# Patient Record
Sex: Female | Born: 1950
Health system: Southern US, Community
[De-identification: ages and names within clinical notes are randomized; demographics above are authoritative.]

## PROBLEM LIST (undated history)

## (undated) DIAGNOSIS — M810 Age-related osteoporosis without current pathological fracture: Secondary | ICD-10-CM

## (undated) DIAGNOSIS — R202 Paresthesia of skin: Secondary | ICD-10-CM

## (undated) DIAGNOSIS — E7800 Pure hypercholesterolemia, unspecified: Secondary | ICD-10-CM

## (undated) DIAGNOSIS — T7840XA Allergy, unspecified, initial encounter: Secondary | ICD-10-CM

## (undated) DIAGNOSIS — R200 Anesthesia of skin: Secondary | ICD-10-CM

## (undated) DIAGNOSIS — I10 Essential (primary) hypertension: Secondary | ICD-10-CM

## (undated) DIAGNOSIS — Z5189 Encounter for other specified aftercare: Secondary | ICD-10-CM

## (undated) DIAGNOSIS — J302 Other seasonal allergic rhinitis: Secondary | ICD-10-CM

## (undated) DIAGNOSIS — Z9109 Other allergy status, other than to drugs and biological substances: Secondary | ICD-10-CM

## (undated) DIAGNOSIS — K5790 Diverticulosis of intestine, part unspecified, without perforation or abscess without bleeding: Secondary | ICD-10-CM

## (undated) DIAGNOSIS — R011 Cardiac murmur, unspecified: Secondary | ICD-10-CM

## (undated) DIAGNOSIS — J45909 Unspecified asthma, uncomplicated: Secondary | ICD-10-CM

## (undated) DIAGNOSIS — C50919 Malignant neoplasm of unspecified site of unspecified female breast: Secondary | ICD-10-CM

## (undated) DIAGNOSIS — C029 Malignant neoplasm of tongue, unspecified: Secondary | ICD-10-CM

## (undated) HISTORY — DX: Pure hypercholesterolemia, unspecified: E78.00

## (undated) HISTORY — PX: FOOT SURGERY: SHX648

## (undated) HISTORY — PX: PELVIC LAPAROSCOPY: SHX162

## (undated) HISTORY — PX: COLONOSCOPY: SHX174

## (undated) HISTORY — PX: MASTECTOMY: SHX3

## (undated) HISTORY — PX: DILATION AND CURETTAGE OF UTERUS: SHX78

## (undated) HISTORY — DX: Paresthesia of skin: R20.2

## (undated) HISTORY — DX: Encounter for other specified aftercare: Z51.89

## (undated) HISTORY — DX: Allergy, unspecified, initial encounter: T78.40XA

## (undated) HISTORY — PX: ECTOPIC PREGNANCY SURGERY: SHX613

## (undated) HISTORY — DX: Unspecified asthma, uncomplicated: J45.909

## (undated) HISTORY — DX: Anesthesia of skin: R20.0

## (undated) HISTORY — DX: Age-related osteoporosis without current pathological fracture: M81.0

## (undated) HISTORY — DX: Diverticulosis of intestine, part unspecified, without perforation or abscess without bleeding: K57.90

## (undated) HISTORY — DX: Other allergy status, other than to drugs and biological substances: Z91.09

## (undated) HISTORY — DX: Cardiac murmur, unspecified: R01.1

## (undated) HISTORY — DX: Other seasonal allergic rhinitis: J30.2

---

## 1990-04-25 HISTORY — PX: BREAST SURGERY: SHX581

## 1992-04-25 HISTORY — PX: BONE MARROW TRANSPLANT: SHX200

## 1998-02-19 ENCOUNTER — Encounter: Admission: RE | Admit: 1998-02-19 | Discharge: 1998-05-20 | Payer: Self-pay | Admitting: Radiation Oncology

## 1998-12-22 ENCOUNTER — Other Ambulatory Visit: Admission: RE | Admit: 1998-12-22 | Discharge: 1998-12-22 | Payer: Self-pay | Admitting: Gynecology

## 1999-04-28 ENCOUNTER — Encounter: Admission: RE | Admit: 1999-04-28 | Discharge: 1999-04-28 | Payer: Self-pay | Admitting: Oncology

## 1999-04-28 ENCOUNTER — Encounter: Payer: Self-pay | Admitting: Oncology

## 1999-10-08 ENCOUNTER — Ambulatory Visit (HOSPITAL_COMMUNITY): Admission: RE | Admit: 1999-10-08 | Discharge: 1999-10-08 | Payer: Self-pay | Admitting: Sports Medicine

## 1999-10-08 ENCOUNTER — Encounter: Payer: Self-pay | Admitting: Sports Medicine

## 1999-10-18 ENCOUNTER — Encounter: Payer: Self-pay | Admitting: Oncology

## 1999-10-18 ENCOUNTER — Encounter: Admission: RE | Admit: 1999-10-18 | Discharge: 1999-10-18 | Payer: Self-pay | Admitting: Oncology

## 2000-01-26 ENCOUNTER — Other Ambulatory Visit: Admission: RE | Admit: 2000-01-26 | Discharge: 2000-01-26 | Payer: Self-pay | Admitting: Gynecology

## 2000-02-01 ENCOUNTER — Encounter: Admission: RE | Admit: 2000-02-01 | Discharge: 2000-02-01 | Payer: Self-pay | Admitting: Gynecology

## 2000-02-01 ENCOUNTER — Encounter: Payer: Self-pay | Admitting: Gynecology

## 2000-03-29 ENCOUNTER — Encounter: Payer: Self-pay | Admitting: Sports Medicine

## 2000-03-29 ENCOUNTER — Ambulatory Visit (HOSPITAL_COMMUNITY): Admission: RE | Admit: 2000-03-29 | Discharge: 2000-03-29 | Payer: Self-pay | Admitting: Sports Medicine

## 2000-10-18 ENCOUNTER — Encounter: Admission: RE | Admit: 2000-10-18 | Discharge: 2000-10-18 | Payer: Self-pay | Admitting: Oncology

## 2000-10-18 ENCOUNTER — Encounter: Payer: Self-pay | Admitting: Oncology

## 2001-02-08 ENCOUNTER — Other Ambulatory Visit: Admission: RE | Admit: 2001-02-08 | Discharge: 2001-02-08 | Payer: Self-pay | Admitting: Gynecology

## 2001-10-19 ENCOUNTER — Encounter: Admission: RE | Admit: 2001-10-19 | Discharge: 2001-10-19 | Payer: Self-pay | Admitting: Gynecology

## 2001-10-19 ENCOUNTER — Encounter: Payer: Self-pay | Admitting: Gynecology

## 2002-02-20 ENCOUNTER — Other Ambulatory Visit: Admission: RE | Admit: 2002-02-20 | Discharge: 2002-02-20 | Payer: Self-pay | Admitting: Gynecology

## 2002-10-21 ENCOUNTER — Encounter: Admission: RE | Admit: 2002-10-21 | Discharge: 2002-10-21 | Payer: Self-pay | Admitting: Gynecology

## 2002-10-21 ENCOUNTER — Encounter: Payer: Self-pay | Admitting: Gynecology

## 2003-04-07 ENCOUNTER — Other Ambulatory Visit: Admission: RE | Admit: 2003-04-07 | Discharge: 2003-04-07 | Payer: Self-pay | Admitting: Gynecology

## 2003-10-22 ENCOUNTER — Encounter: Admission: RE | Admit: 2003-10-22 | Discharge: 2003-10-22 | Payer: Self-pay | Admitting: Internal Medicine

## 2003-10-23 ENCOUNTER — Encounter: Admission: RE | Admit: 2003-10-23 | Discharge: 2003-10-23 | Payer: Self-pay | Admitting: Internal Medicine

## 2004-04-09 ENCOUNTER — Other Ambulatory Visit: Admission: RE | Admit: 2004-04-09 | Discharge: 2004-04-09 | Payer: Self-pay | Admitting: Gynecology

## 2004-10-22 ENCOUNTER — Encounter: Admission: RE | Admit: 2004-10-22 | Discharge: 2004-10-22 | Payer: Self-pay | Admitting: Internal Medicine

## 2005-04-11 ENCOUNTER — Other Ambulatory Visit: Admission: RE | Admit: 2005-04-11 | Discharge: 2005-04-11 | Payer: Self-pay | Admitting: Gynecology

## 2005-10-24 ENCOUNTER — Encounter: Admission: RE | Admit: 2005-10-24 | Discharge: 2005-10-24 | Payer: Self-pay | Admitting: Gynecology

## 2005-11-02 ENCOUNTER — Encounter: Admission: RE | Admit: 2005-11-02 | Discharge: 2005-11-02 | Payer: Self-pay | Admitting: Gynecology

## 2006-04-20 ENCOUNTER — Other Ambulatory Visit: Admission: RE | Admit: 2006-04-20 | Discharge: 2006-04-20 | Payer: Self-pay | Admitting: Gynecology

## 2006-11-29 ENCOUNTER — Encounter: Admission: RE | Admit: 2006-11-29 | Discharge: 2006-11-29 | Payer: Self-pay | Admitting: Gynecology

## 2006-12-19 ENCOUNTER — Ambulatory Visit: Payer: Self-pay | Admitting: Internal Medicine

## 2006-12-25 HISTORY — PX: POLYPECTOMY: SHX149

## 2007-01-01 ENCOUNTER — Encounter: Payer: Self-pay | Admitting: Internal Medicine

## 2007-01-01 ENCOUNTER — Ambulatory Visit: Payer: Self-pay | Admitting: Internal Medicine

## 2007-05-29 ENCOUNTER — Other Ambulatory Visit: Admission: RE | Admit: 2007-05-29 | Discharge: 2007-05-29 | Payer: Self-pay | Admitting: Gynecology

## 2007-11-30 ENCOUNTER — Encounter: Admission: RE | Admit: 2007-11-30 | Discharge: 2007-11-30 | Payer: Self-pay | Admitting: Gynecology

## 2008-06-02 ENCOUNTER — Encounter: Payer: Self-pay | Admitting: Gynecology

## 2008-06-02 ENCOUNTER — Other Ambulatory Visit: Admission: RE | Admit: 2008-06-02 | Discharge: 2008-06-02 | Payer: Self-pay | Admitting: Gynecology

## 2008-06-02 ENCOUNTER — Ambulatory Visit: Payer: Self-pay | Admitting: Gynecology

## 2008-12-01 ENCOUNTER — Encounter: Admission: RE | Admit: 2008-12-01 | Discharge: 2008-12-01 | Payer: Self-pay | Admitting: Internal Medicine

## 2009-04-25 DIAGNOSIS — C029 Malignant neoplasm of tongue, unspecified: Secondary | ICD-10-CM

## 2009-04-25 HISTORY — PX: TONGUE SURGERY: SHX810

## 2009-04-25 HISTORY — DX: Malignant neoplasm of tongue, unspecified: C02.9

## 2009-06-23 ENCOUNTER — Ambulatory Visit: Payer: Self-pay | Admitting: Gynecology

## 2009-06-23 ENCOUNTER — Other Ambulatory Visit: Admission: RE | Admit: 2009-06-23 | Discharge: 2009-06-23 | Payer: Self-pay | Admitting: Gynecology

## 2009-10-02 ENCOUNTER — Ambulatory Visit (HOSPITAL_COMMUNITY): Admission: RE | Admit: 2009-10-02 | Discharge: 2009-10-02 | Payer: Self-pay | Admitting: Otolaryngology

## 2009-10-06 ENCOUNTER — Ambulatory Visit (HOSPITAL_BASED_OUTPATIENT_CLINIC_OR_DEPARTMENT_OTHER): Admission: RE | Admit: 2009-10-06 | Discharge: 2009-10-06 | Payer: Self-pay | Admitting: Otolaryngology

## 2009-12-02 ENCOUNTER — Encounter: Admission: RE | Admit: 2009-12-02 | Discharge: 2009-12-02 | Payer: Self-pay | Admitting: Internal Medicine

## 2010-05-16 ENCOUNTER — Encounter: Payer: Self-pay | Admitting: Internal Medicine

## 2010-06-29 ENCOUNTER — Encounter (INDEPENDENT_AMBULATORY_CARE_PROVIDER_SITE_OTHER): Payer: BC Managed Care – PPO | Admitting: Gynecology

## 2010-06-29 ENCOUNTER — Other Ambulatory Visit (HOSPITAL_COMMUNITY)
Admission: RE | Admit: 2010-06-29 | Discharge: 2010-06-29 | Disposition: A | Discharge status: Home / Self Care | Payer: BC Managed Care – PPO | Source: Ambulatory Visit | Attending: Gynecology | Admitting: Gynecology

## 2010-06-29 ENCOUNTER — Other Ambulatory Visit: Payer: Self-pay | Admitting: Gynecology

## 2010-06-29 DIAGNOSIS — Z01419 Encounter for gynecological examination (general) (routine) without abnormal findings: Secondary | ICD-10-CM

## 2010-06-29 DIAGNOSIS — Z124 Encounter for screening for malignant neoplasm of cervix: Secondary | ICD-10-CM | POA: Insufficient documentation

## 2010-07-05 ENCOUNTER — Other Ambulatory Visit (HOSPITAL_COMMUNITY): Payer: Self-pay | Admitting: Otolaryngology

## 2010-07-05 DIAGNOSIS — Z85810 Personal history of malignant neoplasm of tongue: Secondary | ICD-10-CM

## 2010-07-07 ENCOUNTER — Other Ambulatory Visit (HOSPITAL_COMMUNITY): Payer: BC Managed Care – PPO

## 2010-07-08 ENCOUNTER — Encounter (HOSPITAL_COMMUNITY): Payer: Self-pay

## 2010-07-08 ENCOUNTER — Ambulatory Visit (HOSPITAL_COMMUNITY)
Admission: RE | Admit: 2010-07-08 | Discharge: 2010-07-08 | Disposition: A | Discharge status: Home / Self Care | Payer: BC Managed Care – PPO | Source: Ambulatory Visit | Attending: Otolaryngology | Admitting: Otolaryngology

## 2010-07-08 DIAGNOSIS — C029 Malignant neoplasm of tongue, unspecified: Secondary | ICD-10-CM | POA: Insufficient documentation

## 2010-07-08 DIAGNOSIS — Z85810 Personal history of malignant neoplasm of tongue: Secondary | ICD-10-CM

## 2010-07-08 DIAGNOSIS — M479 Spondylosis, unspecified: Secondary | ICD-10-CM | POA: Insufficient documentation

## 2010-07-08 DIAGNOSIS — Z853 Personal history of malignant neoplasm of breast: Secondary | ICD-10-CM | POA: Insufficient documentation

## 2010-07-08 HISTORY — DX: Essential (primary) hypertension: I10

## 2010-07-08 HISTORY — DX: Malignant neoplasm of tongue, unspecified: C02.9

## 2010-07-08 HISTORY — DX: Malignant neoplasm of unspecified site of unspecified female breast: C50.919

## 2010-07-08 MED ORDER — IOHEXOL 300 MG/ML  SOLN
100.0000 mL | Freq: Once | INTRAMUSCULAR | Status: AC | PRN
  Administered 2010-07-08: 100 mL via INTRAVENOUS

## 2010-07-12 LAB — BASIC METABOLIC PANEL
BUN: 17 mg/dL (ref 6–23)
CO2: 27 mEq/L (ref 19–32)
Calcium: 9.1 mg/dL (ref 8.4–10.5)
Chloride: 108 mEq/L (ref 96–112)
Creatinine, Ser: 0.84 mg/dL (ref 0.4–1.2)
GFR calc Af Amer: >60 mL/min (ref >60)
GFR calc non Af Amer: >60 mL/min (ref >60)

## 2010-08-04 ENCOUNTER — Other Ambulatory Visit: Payer: Self-pay | Admitting: Otolaryngology

## 2010-08-04 DIAGNOSIS — E041 Nontoxic single thyroid nodule: Secondary | ICD-10-CM

## 2010-08-09 ENCOUNTER — Other Ambulatory Visit (HOSPITAL_COMMUNITY): Payer: Self-pay | Admitting: Otolaryngology

## 2010-08-09 DIAGNOSIS — R599 Enlarged lymph nodes, unspecified: Secondary | ICD-10-CM

## 2010-08-10 ENCOUNTER — Ambulatory Visit (HOSPITAL_COMMUNITY)
Admission: RE | Admit: 2010-08-10 | Discharge: 2010-08-10 | Disposition: A | Discharge status: Home / Self Care | Payer: BLUE CROSS/BLUE SHIELD | Source: Ambulatory Visit | Attending: Otolaryngology | Admitting: Otolaryngology

## 2010-08-10 ENCOUNTER — Other Ambulatory Visit: Payer: BC Managed Care – PPO

## 2010-08-10 ENCOUNTER — Other Ambulatory Visit: Payer: Self-pay | Admitting: Interventional Radiology

## 2010-08-10 DIAGNOSIS — Z85810 Personal history of malignant neoplasm of tongue: Secondary | ICD-10-CM | POA: Insufficient documentation

## 2010-08-10 DIAGNOSIS — R599 Enlarged lymph nodes, unspecified: Secondary | ICD-10-CM | POA: Insufficient documentation

## 2010-09-24 HISTORY — PX: OTHER SURGICAL HISTORY: SHX169

## 2010-10-05 ENCOUNTER — Other Ambulatory Visit (HOSPITAL_COMMUNITY): Payer: Self-pay | Admitting: Otolaryngology

## 2010-10-05 DIAGNOSIS — IMO0002 Reserved for concepts with insufficient information to code with codable children: Secondary | ICD-10-CM

## 2010-10-11 ENCOUNTER — Encounter (HOSPITAL_COMMUNITY)
Admission: RE | Admit: 2010-10-11 | Discharge: 2010-10-11 | Disposition: A | Discharge status: Home / Self Care | Payer: BC Managed Care – PPO | Source: Ambulatory Visit | Attending: Otolaryngology | Admitting: Otolaryngology

## 2010-10-11 ENCOUNTER — Encounter (HOSPITAL_COMMUNITY): Payer: Self-pay

## 2010-10-11 DIAGNOSIS — Z9010 Acquired absence of unspecified breast and nipple: Secondary | ICD-10-CM | POA: Insufficient documentation

## 2010-10-11 DIAGNOSIS — IMO0002 Reserved for concepts with insufficient information to code with codable children: Secondary | ICD-10-CM

## 2010-10-11 DIAGNOSIS — C029 Malignant neoplasm of tongue, unspecified: Secondary | ICD-10-CM | POA: Insufficient documentation

## 2010-10-11 DIAGNOSIS — J984 Other disorders of lung: Secondary | ICD-10-CM | POA: Insufficient documentation

## 2010-10-11 MED ORDER — FLUDEOXYGLUCOSE F - 18 (FDG) INJECTION
16.9000 | Freq: Once | INTRAVENOUS | Status: AC | PRN
  Administered 2010-10-11: 16.9 via INTRAVENOUS

## 2010-10-13 ENCOUNTER — Encounter (HOSPITAL_COMMUNITY)
Admission: RE | Admit: 2010-10-13 | Discharge: 2010-10-13 | Disposition: A | Discharge status: Home / Self Care | Payer: BC Managed Care – PPO | Source: Ambulatory Visit | Attending: Otolaryngology | Admitting: Otolaryngology

## 2010-10-13 LAB — CBC
MCV: 96 fL (ref 78.0–100.0)
RBC: 3.97 MIL/uL (ref 3.87–5.11)
WBC: 8.2 10*3/uL (ref 4.0–10.5)

## 2010-10-13 LAB — COMPREHENSIVE METABOLIC PANEL
ALT: 21 U/L (ref 0–35)
Alkaline Phosphatase: 72 U/L (ref 39–117)
CO2: 26 mEq/L (ref 19–32)
GFR calc Af Amer: >60 mL/min (ref >60)
GFR calc non Af Amer: >60 mL/min (ref >60)
Glucose, Bld: 119 mg/dL — ABNORMAL HIGH (ref 70–99)
Potassium: 3.6 mEq/L (ref 3.5–5.1)
Sodium: 141 mEq/L (ref 135–145)
Total Bilirubin: 0.3 mg/dL (ref 0.3–1.2)

## 2010-10-13 LAB — DIFFERENTIAL
Basophils Relative: 0 % (ref 0–1)
Eosinophils Relative: 2 % (ref 0–5)
Lymphocytes Relative: 31 % (ref 12–46)
Monocytes Absolute: 0.6 10*3/uL (ref 0.1–1.0)

## 2010-10-13 LAB — URINALYSIS, ROUTINE W REFLEX MICROSCOPIC
Bilirubin Urine: NEGATIVE
Glucose, UA: 250 mg/dL — AB
Hgb urine dipstick: NEGATIVE
Specific Gravity, Urine: 1.016 (ref 1.005–1.030)

## 2010-10-13 LAB — PROTIME-INR: Prothrombin Time: 11.8 seconds (ref 11.6–15.2)

## 2010-10-18 ENCOUNTER — Inpatient Hospital Stay (HOSPITAL_COMMUNITY)
Admission: RE | Admit: 2010-10-18 | Discharge: 2010-10-20 | DRG: 875 | Disposition: A | Discharge status: Home / Self Care | Payer: BC Managed Care – PPO | Source: Ambulatory Visit | Attending: Otolaryngology | Admitting: Otolaryngology

## 2010-10-18 ENCOUNTER — Other Ambulatory Visit: Payer: Self-pay | Admitting: Otolaryngology

## 2010-10-18 DIAGNOSIS — Z85810 Personal history of malignant neoplasm of tongue: Secondary | ICD-10-CM

## 2010-10-18 DIAGNOSIS — I10 Essential (primary) hypertension: Secondary | ICD-10-CM | POA: Diagnosis present

## 2010-10-18 DIAGNOSIS — Z01810 Encounter for preprocedural cardiovascular examination: Secondary | ICD-10-CM

## 2010-10-18 DIAGNOSIS — Z79899 Other long term (current) drug therapy: Secondary | ICD-10-CM

## 2010-10-18 DIAGNOSIS — F411 Generalized anxiety disorder: Secondary | ICD-10-CM | POA: Diagnosis present

## 2010-10-18 DIAGNOSIS — C770 Secondary and unspecified malignant neoplasm of lymph nodes of head, face and neck: Principal | ICD-10-CM | POA: Diagnosis present

## 2010-10-18 DIAGNOSIS — Z853 Personal history of malignant neoplasm of breast: Secondary | ICD-10-CM

## 2010-11-03 ENCOUNTER — Ambulatory Visit
Admission: RE | Admit: 2010-11-03 | Discharge: 2010-11-03 | Disposition: A | Discharge status: Home / Self Care | Payer: BC Managed Care – PPO | Source: Ambulatory Visit | Attending: Radiation Oncology | Admitting: Radiation Oncology

## 2010-11-03 DIAGNOSIS — Z853 Personal history of malignant neoplasm of breast: Secondary | ICD-10-CM | POA: Insufficient documentation

## 2010-11-03 DIAGNOSIS — E7800 Pure hypercholesterolemia, unspecified: Secondary | ICD-10-CM | POA: Insufficient documentation

## 2010-11-03 DIAGNOSIS — Z79899 Other long term (current) drug therapy: Secondary | ICD-10-CM | POA: Insufficient documentation

## 2010-11-03 DIAGNOSIS — C021 Malignant neoplasm of border of tongue: Secondary | ICD-10-CM | POA: Insufficient documentation

## 2010-11-17 NOTE — Op Note (Signed)
Diane Macdonald, Diane Macdonald                 ACCOUNT NO.:  [id number]  MEDICAL RECORD NO.:  [id number]  LOCATION:                                 FACILITY:  PHYSICIAN:  Kristine Garbe. Ezzard Standing, M.D.DATE OF BIRTH:  1951/01/08  DATE OF PROCEDURE: DATE OF DISCHARGE:                              OPERATIVE REPORT   PREOPERATIVE DIAGNOSIS:  Metastatic squamous cell carcinoma to the right neck.  POSTOPERATIVE DIAGNOSIS:  Metastatic squamous cell carcinoma to the right neck.  OPERATION PERFORMED:  Modified right radical neck dissection.  SURGEON:  Kristine Garbe. Ezzard Standing, MD  ASSISTANT SURGEON:  Hermelinda Medicus, MD  ANESTHESIA:  General endotracheal.  ESTIMATED BLOOD LOSS:  Less than 50 mL.  COMPLICATIONS:  None.  CLINICAL NOTE:  Diane Macdonald is a 60 year old female who is approximately 1 year status post a right partial glossectomy for a right lateral tongue T1 squamous cell carcinoma.  She was subsequently found to have adenopathy lower in the neck on followup CT scan with fine needle aspirate positive for squamous cell carcinoma.  She is taken to the operating room at this time for a right neck dissection for metastatic squamous cell carcinoma from right lateral tongue.  DESCRIPTION OF PROCEDURE:  After adequate endotracheal anesthesia, the right neck node was palpated and it was located mid lower jugular chain of lymph nodes just on the posterior portion of the sternocleidomastoid muscle on the right side.  It measured a little less than 2 cm in size and was firm to palpation.  She had no other palpable adenopathy and no other adenopathy noted on the PET scan.  A horizontal incision was started at the mastoid tip and brought down mid right neck just above the level of the mass.  A separate vertical incision was brought back just posterior to the mass.  Subplatysmal flaps were elevated superiorly and inferiorly so as to expose the external jugular vein and the sternocleidomastoid muscle.   It was elected to preserve the sternocleidomastoid muscle except for the area just overlying the neck node and this portion of the sternocleidomastoid muscle overlying the neck node was amputated and transected and occluded with the specimen. First dissection was carried out superiorly where the accessory nerve, eleventh nerve was identified as it exited superiorly from the sternocleidomastoid muscle.  Dissection was then carried out toward the submandibular gland which was preserved and not sacrificed.  There was one node posteriorly along the posterior aspect of the submandibular gland that was removed and sent as a separate specimen as the submandibular lymph node.  Digastric muscle was identified and this was followed posteriorly and retracted superiorly so as to identify the carotid artery, vagus nerve, and internal jugular vein as well as the accessory nerve.  At this point, just below the posterior belly of the digastric muscle, the internal jugular vein was dissected out and being careful to preserve the eleventh cranial nerve, the jugular vein was ligated with 2-0 silk suture and 2-0 silk suture ligature and divided superiorly.  The sensory nerve was followed down the posterior aspect of the neck and preserved.  Next, the vagus nerve and carotid artery were identified and the jugular vein was  dissected off the carotid artery and vagus nerve extending inferiorly.  The lower neck node that was positive was sitting just above the omohyoid muscle and it was elected to go ahead and take the omohyoid muscle and take the jugular vein down just inferior to the omohyoid muscle.  She had had previous supraclavicular nodes removed because of the history of breast cancer 15 years ago and there was some scar tissue noted along the internal jugular vein in the inferior aspect.  At this point, just below the omohyoid muscle, the inferior aspect of the jugular vein was carefully dissected out  and the jugular vein was again dissected out and ligated with 2-0 silk suture and 2-0 silk suture ligature and divided inferiorly.  The jugular vein along the associated lymphadenopathy was dissected off the deep cervical fascia.  The supraclavicular fat was dissected off just superior to the clavicle down to the deep cervical fascia.  The superior jugular vein was ligated with 2-0 silk sutures superiorly and inferiorly.  The sternocleidomastoid muscle was dissected out and preserved except for the portion overlying the positive adenopathy on the right neck, which was left attached.  Neck specimen was removed and sent to Pathology.  A long stitch was placed around the superior aspect of the external jugular vein.  The inferior aspect of the internal jugular vein suture was left approximately length of about 3 cm and was left attached to the inferior aspect of the jugular vein inferiorly.  The upper ligation sutures of the internal jugular vein were cut to the several millimeters length.  Neck specimen was sent and marked with a single long suture, 6- 8 cm long off the superior aspect external jugular vein, a 3-4 cm suture off the ligated portion of the inferior aspect of the internal jugular vein.  A separate submandibular node was removed and sent as a separate specimen.  Hemostasis was obtained with a bipolar cautery.  Neck was copiously irrigated with saline.  A 14-French JP drain was brought out through a separate stab incision posteriorly and inferiorly in the neck and the neck was closed with 3-0 chromic sutures subcutaneously and staples to reapproximate the skin edges.  Bacitracin ointment was applied.  Diane Macdonald was awoke from anesthesia and transferred to the recovery room postop doing well.  DISPOSITION:  Diane Macdonald will be admitted to the hospital for a couple of day observation and watch her JP drain.  We will plan on removing this in 2- 3 days and discharge her home.  She received  perioperative antibiotic Ancef.          ______________________________ Kristine Garbe. Ezzard Standing, M.D.     CEN/MEDQ  D:  10/18/2010  T:  10/19/2010  Job:  [id number]  cc:   [facility] Geoffry Paradise, M.D. Redge Gainer. Perini, M.D. Hermelinda Medicus, M.D.  Electronically Signed by Dillard Cannon M.D. on 11/17/2010 11:35:55 AM

## 2010-11-30 ENCOUNTER — Other Ambulatory Visit: Payer: Self-pay | Admitting: Internal Medicine

## 2010-11-30 DIAGNOSIS — Z1231 Encounter for screening mammogram for malignant neoplasm of breast: Secondary | ICD-10-CM

## 2010-12-07 ENCOUNTER — Ambulatory Visit: Payer: BC Managed Care – PPO

## 2010-12-07 ENCOUNTER — Ambulatory Visit
Admission: RE | Admit: 2010-12-07 | Discharge: 2010-12-07 | Disposition: A | Discharge status: Home / Self Care | Payer: BC Managed Care – PPO | Source: Ambulatory Visit | Attending: Internal Medicine | Admitting: Internal Medicine

## 2010-12-07 DIAGNOSIS — Z1231 Encounter for screening mammogram for malignant neoplasm of breast: Secondary | ICD-10-CM

## 2011-06-24 ENCOUNTER — Other Ambulatory Visit: Payer: Self-pay | Admitting: Otolaryngology

## 2011-06-24 DIAGNOSIS — R599 Enlarged lymph nodes, unspecified: Secondary | ICD-10-CM

## 2011-06-24 DIAGNOSIS — R591 Generalized enlarged lymph nodes: Secondary | ICD-10-CM

## 2011-06-29 ENCOUNTER — Ambulatory Visit
Admission: RE | Admit: 2011-06-29 | Discharge: 2011-06-29 | Disposition: A | Discharge status: Home / Self Care | Payer: BC Managed Care – PPO | Source: Ambulatory Visit | Attending: Otolaryngology | Admitting: Otolaryngology

## 2011-06-29 DIAGNOSIS — M8580 Other specified disorders of bone density and structure, unspecified site: Secondary | ICD-10-CM | POA: Insufficient documentation

## 2011-06-29 DIAGNOSIS — R599 Enlarged lymph nodes, unspecified: Secondary | ICD-10-CM

## 2011-06-29 DIAGNOSIS — R591 Generalized enlarged lymph nodes: Secondary | ICD-10-CM

## 2011-06-29 DIAGNOSIS — E7800 Pure hypercholesterolemia, unspecified: Secondary | ICD-10-CM | POA: Insufficient documentation

## 2011-06-29 DIAGNOSIS — C50919 Malignant neoplasm of unspecified site of unspecified female breast: Secondary | ICD-10-CM | POA: Insufficient documentation

## 2011-06-29 MED ORDER — IOHEXOL 300 MG/ML  SOLN
75.0000 mL | Freq: Once | INTRAMUSCULAR | Status: AC | PRN
  Administered 2011-06-29: 75 mL via INTRAVENOUS

## 2011-07-12 ENCOUNTER — Ambulatory Visit (INDEPENDENT_AMBULATORY_CARE_PROVIDER_SITE_OTHER): Payer: BC Managed Care – PPO | Admitting: Gynecology

## 2011-07-12 ENCOUNTER — Encounter: Payer: Self-pay | Admitting: Gynecology

## 2011-07-12 ENCOUNTER — Other Ambulatory Visit (HOSPITAL_COMMUNITY)
Admission: RE | Admit: 2011-07-12 | Discharge: 2011-07-12 | Disposition: A | Discharge status: Home / Self Care | Payer: BC Managed Care – PPO | Source: Ambulatory Visit | Attending: Gynecology | Admitting: Gynecology

## 2011-07-12 VITALS — BP 126/82 | Ht 63.5 in | Wt 145.0 lb

## 2011-07-12 DIAGNOSIS — J302 Other seasonal allergic rhinitis: Secondary | ICD-10-CM | POA: Insufficient documentation

## 2011-07-12 DIAGNOSIS — Z9109 Other allergy status, other than to drugs and biological substances: Secondary | ICD-10-CM | POA: Insufficient documentation

## 2011-07-12 DIAGNOSIS — Z01419 Encounter for gynecological examination (general) (routine) without abnormal findings: Secondary | ICD-10-CM

## 2011-07-12 NOTE — Progress Notes (Signed)
LOYOLA SANTINO 26-Oct-1950 [id number]        61 y.o.  for annual exam.  Doing well from a gynecologic standpoint.  Past medical history,surgical history, medications, allergies, family history and social history were all reviewed and documented in the EPIC chart. ROS:  Was performed and pertinent positives and negatives are included in the history.  Exam: Sherrilyn Rist chaperone present Filed Vitals:   07/12/11 1547  BP: 126/82   General appearance  Normal Skin grossly normal Head/Neck normal with no cervical or supraclavicular adenopathy thyroid normal Lungs  clear Cardiac RR, without RMG Abdominal  soft, nontender, without masses, organomegaly or hernia Breasts  examined lying and sitting. Left without masses, retractions, discharge or axillary adenopathy.  Right with implant/reconstruction without masses retractions discharge adenopathy. Pelvic  Ext/BUS/vagina  normal with atrophic genital changes  Cervix  normal  Atrophic changes Pap done  Uterus  axial, normal size, shape and contour, midline and mobile nontender   Adnexa  Without masses or tenderness    Anus and perineum  normal   Rectovaginal  normal sphincter tone without palpated masses or tenderness.    Assessment/Plan:  61 y.o. female for annual exam.    1. History tongue cancer. Had right neck dissection with lymph node dissection with one positive node this past year. Being followed expectantly at this point. Actively seen by her oncologist. 2. History of breast cancer. Had her mammogram August 2012. Continue with annual mammography. SBE reviewed. 3. Colonoscopy. Due this year and she is scheduling this. 4. Osteopenia. Due for bone density this year to Dr. Daine Gravel office and we'll schedule this. She is on Evista.  Increase calcium vitamin D. 5. Pap smear. Pap smear was done today given her unusual cancer history we'll continue to screen annually. 6. Health maintenance. No blood work was done today as is all done through Dr.  Daine Gravel office who follows her. Assuming she continues well from a gynecologic standpoint and she will see me in one year sooner as needed.    Dara Lords MD, 4:14 PM 07/12/2011

## 2011-07-12 NOTE — Patient Instructions (Signed)
Follow up in one year for your annual gynecologic exam. 

## 2011-11-08 ENCOUNTER — Other Ambulatory Visit: Payer: Self-pay | Admitting: Internal Medicine

## 2011-11-08 DIAGNOSIS — Z9011 Acquired absence of right breast and nipple: Secondary | ICD-10-CM

## 2011-11-08 DIAGNOSIS — Z1231 Encounter for screening mammogram for malignant neoplasm of breast: Secondary | ICD-10-CM

## 2011-12-09 ENCOUNTER — Ambulatory Visit: Payer: BC Managed Care – PPO

## 2011-12-15 ENCOUNTER — Ambulatory Visit
Admission: RE | Admit: 2011-12-15 | Discharge: 2011-12-15 | Disposition: A | Discharge status: Home / Self Care | Payer: BC Managed Care – PPO | Source: Ambulatory Visit | Attending: Internal Medicine | Admitting: Internal Medicine

## 2011-12-15 DIAGNOSIS — Z1231 Encounter for screening mammogram for malignant neoplasm of breast: Secondary | ICD-10-CM

## 2011-12-15 DIAGNOSIS — Z9011 Acquired absence of right breast and nipple: Secondary | ICD-10-CM

## 2012-02-10 ENCOUNTER — Encounter: Payer: Self-pay | Admitting: Internal Medicine

## 2012-07-12 ENCOUNTER — Other Ambulatory Visit (HOSPITAL_COMMUNITY)
Admission: RE | Admit: 2012-07-12 | Discharge: 2012-07-12 | Disposition: A | Discharge status: Home / Self Care | Payer: BC Managed Care – PPO | Source: Ambulatory Visit | Attending: Gynecology | Admitting: Gynecology

## 2012-07-12 ENCOUNTER — Ambulatory Visit (INDEPENDENT_AMBULATORY_CARE_PROVIDER_SITE_OTHER): Payer: Self-pay | Admitting: Gynecology

## 2012-07-12 ENCOUNTER — Encounter: Payer: Self-pay | Admitting: Gynecology

## 2012-07-12 VITALS — BP 124/76 | Ht 64.0 in | Wt 148.0 lb

## 2012-07-12 DIAGNOSIS — Z01419 Encounter for gynecological examination (general) (routine) without abnormal findings: Secondary | ICD-10-CM | POA: Insufficient documentation

## 2012-07-12 DIAGNOSIS — M8580 Other specified disorders of bone density and structure, unspecified site: Secondary | ICD-10-CM

## 2012-07-12 DIAGNOSIS — Z1151 Encounter for screening for human papillomavirus (HPV): Secondary | ICD-10-CM | POA: Insufficient documentation

## 2012-07-12 DIAGNOSIS — M899 Disorder of bone, unspecified: Secondary | ICD-10-CM

## 2012-07-12 DIAGNOSIS — M949 Disorder of cartilage, unspecified: Secondary | ICD-10-CM

## 2012-07-12 NOTE — Progress Notes (Signed)
Diane Macdonald 03/21/1951 [id number]        62 y.o.  [id number] for annual exam.  Doing well from a gynecologic standpoint.  Past medical history,surgical history, medications, allergies, family history and social history were all reviewed and documented in the EPIC chart. ROS:  Was performed and pertinent positives and negatives are included in the history.  Exam: Kim assistant Filed Vitals:   07/12/12 1500  BP: 124/76  Height: 5\' 4"  (1.626 m)  Weight: 148 lb (67.132 kg)   General appearance  Normal Skin grossly normal Head/Neck normal with no cervical or supraclavicular adenopathy thyroid normal Lungs  clear Cardiac RR, without RMG Abdominal  soft, nontender, without masses, organomegaly or hernia Breasts  examined lying and sitting. Left without masses, retractions, discharge or axillary adenopathy.  Right status post reconstruction with implant without masses or adenopathy. Pelvic  Ext/BUS/vagina  normal with atrophic changes  Cervix  flush with the upper vagina  Uterus  small midline mobile nontender  Adnexa  Without masses or tenderness    Anus and perineum  normal   Rectovaginal  normal sphincter tone without palpated masses or tenderness.    Assessment/Plan:  62 y.o. [id number] female for annual exam.   1. Postmenopausal. Doing well from a lack of symptoms standpoint without significant hot flashes night sweats vaginal dryness or irritation. We'll continue to monitor. 2. History of breast cancer 1994. Up to date with mammograms. SBE reviewed.  Discussed BRCA testing area she did develop breast cancer at age 41. There are no other family members with breast cancer or ovarian cancer the pros/cons of BRCA testing reviewed particularly for her daughters standpoint as well as the issues of prophylactic oophorectomy. 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 Prior ultrasound did not identify ovarian tissue following that consistent  with atrophic changes. Patient at this point does not want BRCA testing. 3. Tongue cancer 2011.  Actively being followed by oncology. 4. Osteopenia. DEXA reported 2 years ago. Do not have a report of this and Dr. Jacky Kindle is following her for this. Increase calcium vitamin D reviewed. 5. Pap smear 2013. Pap/HPV done today given her unusual history of carcinoma in the past. Continue with annual Pap smears. 6. Colonoscopy 6 years ago. Repeat at their recommended interval. 7. Health maintenance. The blood work done this is all done through her primary physician's office. Followup one year, sooner as needed.    Dara Lords MD, 3:30 PM 07/12/2012

## 2012-07-12 NOTE — Patient Instructions (Addendum)
Followup in 1 year for GYN exam.

## 2012-07-13 LAB — URINALYSIS W MICROSCOPIC + REFLEX CULTURE
Bilirubin Urine: NEGATIVE
Glucose, UA: 100 mg/dL — AB
Hgb urine dipstick: NEGATIVE
Ketones, ur: NEGATIVE mg/dL
Protein, ur: NEGATIVE mg/dL
Squamous Epithelial / LPF: NONE SEEN

## 2012-07-18 ENCOUNTER — Other Ambulatory Visit: Payer: Self-pay | Admitting: Gynecology

## 2012-07-18 DIAGNOSIS — R81 Glycosuria: Secondary | ICD-10-CM

## 2012-08-22 ENCOUNTER — Encounter: Payer: Self-pay | Admitting: Internal Medicine

## 2012-10-17 ENCOUNTER — Other Ambulatory Visit: Payer: Self-pay | Admitting: Otolaryngology

## 2012-10-17 DIAGNOSIS — Z85810 Personal history of malignant neoplasm of tongue: Secondary | ICD-10-CM

## 2012-10-30 ENCOUNTER — Ambulatory Visit
Admission: RE | Admit: 2012-10-30 | Discharge: 2012-10-30 | Disposition: A | Discharge status: Home / Self Care | Payer: BC Managed Care – PPO | Source: Ambulatory Visit | Attending: Otolaryngology | Admitting: Otolaryngology

## 2012-10-30 DIAGNOSIS — Z85810 Personal history of malignant neoplasm of tongue: Secondary | ICD-10-CM

## 2012-10-30 MED ORDER — IOHEXOL 300 MG/ML  SOLN
75.0000 mL | Freq: Once | INTRAMUSCULAR | Status: AC | PRN
  Administered 2012-10-30: 75 mL via INTRAVENOUS

## 2012-12-03 ENCOUNTER — Other Ambulatory Visit: Payer: Self-pay

## 2012-12-03 DIAGNOSIS — Z1231 Encounter for screening mammogram for malignant neoplasm of breast: Secondary | ICD-10-CM

## 2012-12-03 DIAGNOSIS — Z9011 Acquired absence of right breast and nipple: Secondary | ICD-10-CM

## 2012-12-03 DIAGNOSIS — Z853 Personal history of malignant neoplasm of breast: Secondary | ICD-10-CM

## 2012-12-17 ENCOUNTER — Ambulatory Visit: Payer: BC Managed Care – PPO

## 2013-01-09 ENCOUNTER — Ambulatory Visit
Admission: RE | Admit: 2013-01-09 | Discharge: 2013-01-09 | Disposition: A | Discharge status: Home / Self Care | Payer: BC Managed Care – PPO | Source: Ambulatory Visit

## 2013-01-09 DIAGNOSIS — Z1231 Encounter for screening mammogram for malignant neoplasm of breast: Secondary | ICD-10-CM

## 2013-01-09 DIAGNOSIS — Z9011 Acquired absence of right breast and nipple: Secondary | ICD-10-CM

## 2013-01-09 DIAGNOSIS — Z853 Personal history of malignant neoplasm of breast: Secondary | ICD-10-CM

## 2013-04-25 DIAGNOSIS — M810 Age-related osteoporosis without current pathological fracture: Secondary | ICD-10-CM

## 2013-04-25 HISTORY — DX: Age-related osteoporosis without current pathological fracture: M81.0

## 2013-07-02 IMAGING — CT CT NECK W/ CM
4 of 5 series · 10 of 20 positions shown, 11 images · IV contrast (75CC OMNI 300)
Comparison: Free node dissection PET CT 10/11/2010 and Neck CT
07/08/2010.

CLINICAL DATA: 60-year-old female with history of tongue cancer
status post surgery, node dissection. Node dissection on the right
at level III positive for metastatic squamous cell carcinoma with
no extracapsular extension.. Question mass at surgical site.
Remote history of breast cancer (6889).

CT NECK WITH CONTRAST
TECHNIQUE: Multidetector CT imaging of the neck was performed with
intravenous contrast.
Contrast: 75mL OMNIPAQUE IOHEXOL 300 MG/ML IJ SOLN

[Series 3: axial neck · axial · 0.39mm/px · z∈[+88,+204]mm · 3 of 94 slices shown, 4 images]
[im 24/94  soft-tissue]
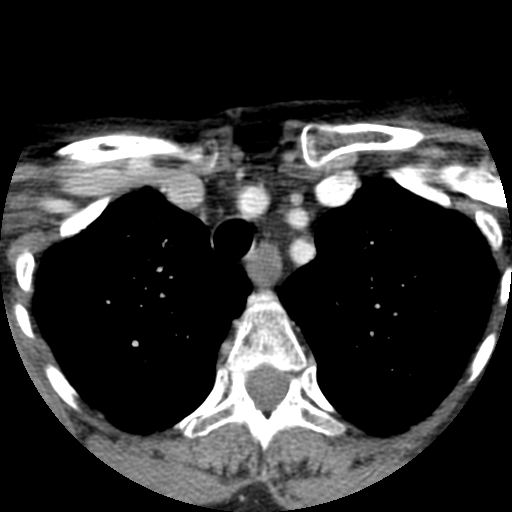
[im 24/94  bone]
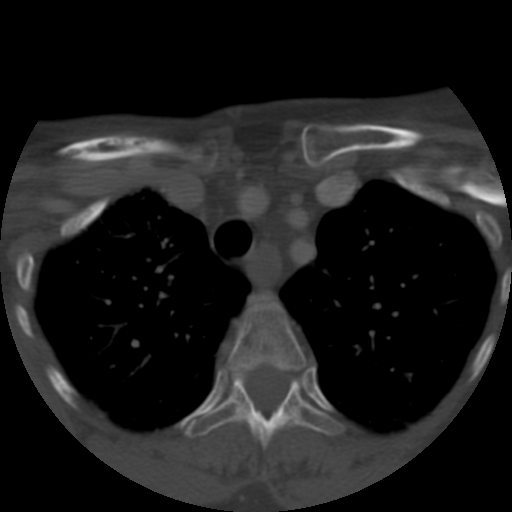
[im 47/94  bone]
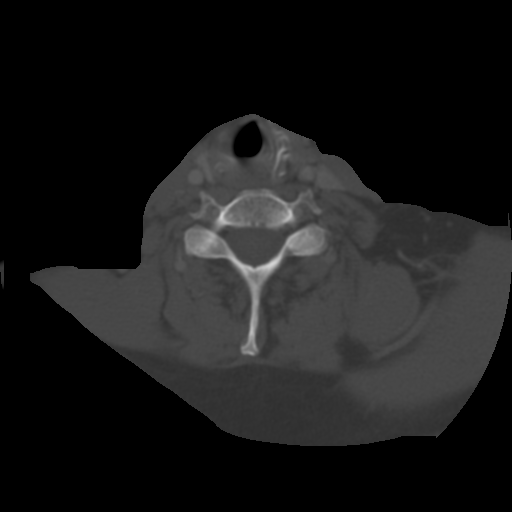
[im 70/94  bone]
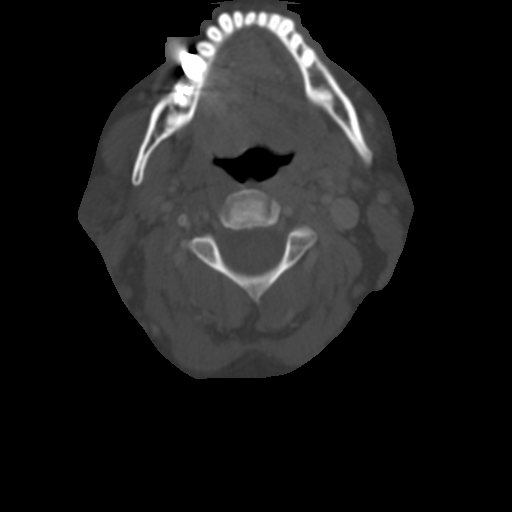

[Series 200: angled axial · axial · 0.39mm/px · z∈[+83,+154]mm · 2 of 92 slices shown]
[im 31/92  bone]
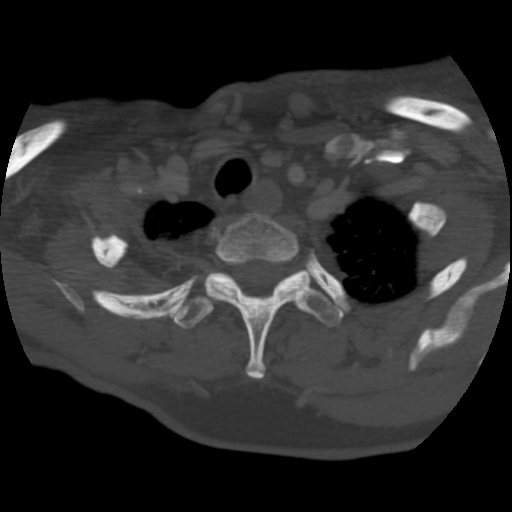
[im 61/92  bone]
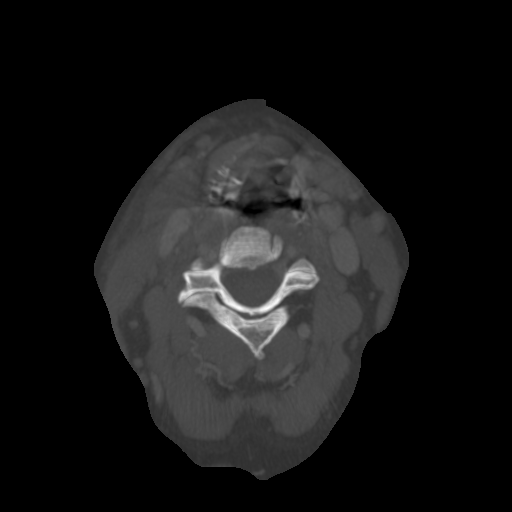

[Series 601: cor soft tissue neck · coronal · 0.46mm/px · 3 of 97 slices shown]
[im 20/97  bone]
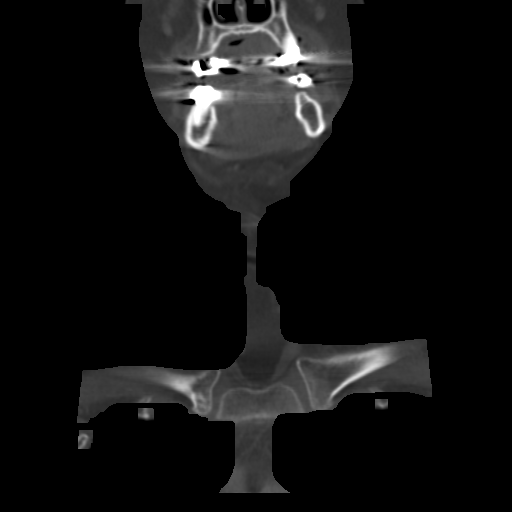
[im 39/97  bone]
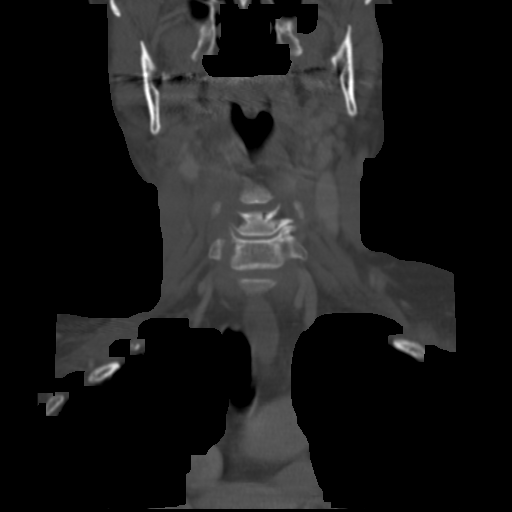
[im 58/97  bone]
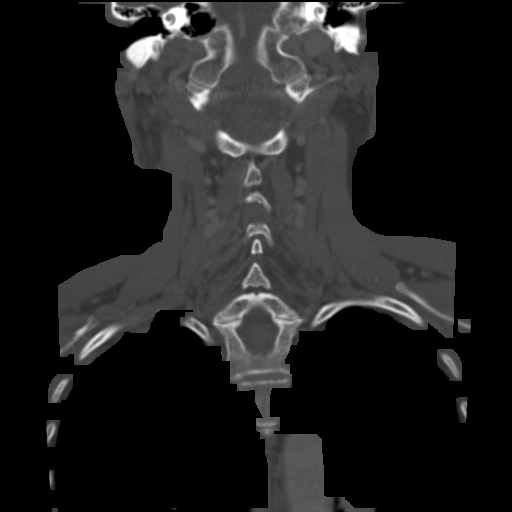

[Series 602: sag soft tissue neck · sagittal · 0.46mm/px · 2 of 98 slices shown]
[im 33/98  soft-tissue]
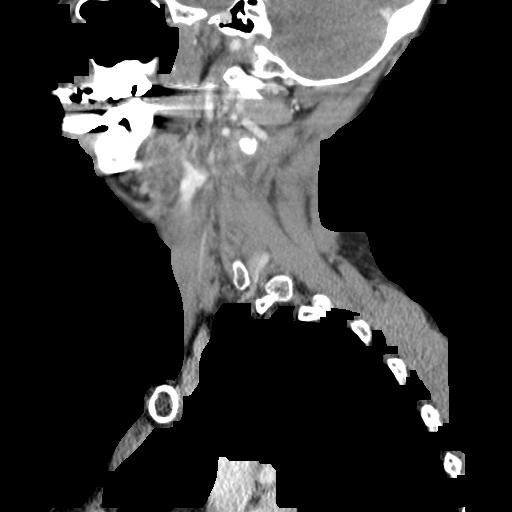
[im 65/98  soft-tissue]
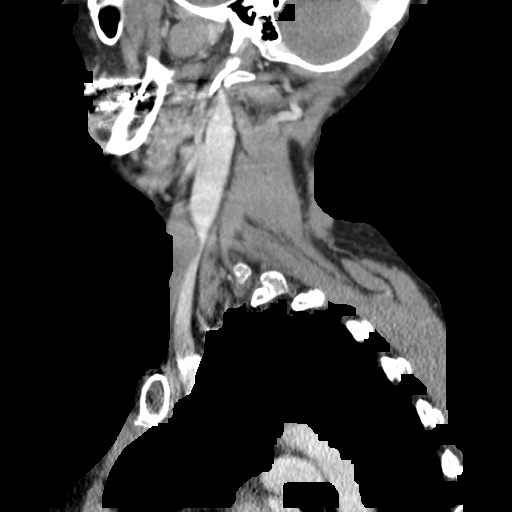

[10 of 20 positions shown; findings below may reference images not displayed]

FINDINGS: Motion artifact is again present today through the
hypopharynx and larynx.  Sequelae of selective right neck
dissection are evident.  Asymmetric appearance of the right
sternocleidomastoid muscle at the marked area of clinical concern
is noted (series 3 image 34).  Favor this is postoperative.

There are small right level II and level V lymph nodes in this
region measuring up to 5 mm in short axis. Right level I nodes
measure up to 4 mm.  There may be a small posterior parotid space
node also measuring 4 mm on image 25.  Mild asymmetric thickening
of the right platysma and other postoperative changes are noted on
the right with no  enlarged right cervical lymph node or mass. The
right submandibular gland is mildly hypoenhancing compared to that
on the left.

No left side cervical lymphadenopathy.  Negative retropharyngeal,
parapharyngeal and sublingual spaces. Negative thyroid.  Negative
visualized superior mediastinum.

Major vascular structures are patent, the right internal jugular
vein is surgically absent.  Negative visualized brain parenchyma.
Visualized paranasal sinuses and mastoids are clear.  No acute
osseous abnormality identified.

Chronic right apical lung disease with bronchiectasis and confluent
airspace disease is stable since 9999.  This probably represents
post radiation change related to the previous breast cancer as
there are surgical changes also noted to the right chest wall and
axilla.
IMPRESSION: 1.  Interval selective right neck dissection.  Palpable area of
concern on series 3 image 34 seems to correspond to the asymmetry
of the right sternocleidomastoid muscle, with no suspicious neck
mass or lymph node identified.  Clinical follow-up recommended,
with repeat CT if changes are detected in this area.
2.  Motion artifact through the hypopharynx and larynx.

3.  Chronic post therapy changes in the visualized right chest.

## 2013-07-18 ENCOUNTER — Ambulatory Visit (INDEPENDENT_AMBULATORY_CARE_PROVIDER_SITE_OTHER): Payer: BC Managed Care – PPO | Admitting: Gynecology

## 2013-07-18 ENCOUNTER — Other Ambulatory Visit (HOSPITAL_COMMUNITY)
Admission: RE | Admit: 2013-07-18 | Discharge: 2013-07-18 | Disposition: A | Discharge status: Home / Self Care | Payer: BC Managed Care – PPO | Source: Ambulatory Visit | Attending: Gynecology | Admitting: Gynecology

## 2013-07-18 ENCOUNTER — Encounter: Payer: Self-pay | Admitting: Gynecology

## 2013-07-18 VITALS — BP 120/76 | Ht 63.0 in | Wt 146.0 lb

## 2013-07-18 DIAGNOSIS — Z01419 Encounter for gynecological examination (general) (routine) without abnormal findings: Secondary | ICD-10-CM | POA: Insufficient documentation

## 2013-07-18 DIAGNOSIS — N952 Postmenopausal atrophic vaginitis: Secondary | ICD-10-CM

## 2013-07-18 NOTE — Progress Notes (Signed)
Diane Macdonald [birth year] [id number]        63 y.o.  [id number] for annual exam.  Doing well.  Past medical history,surgical history, problem list, medications, allergies, family history and social history were all reviewed and documented in the EPIC chart.  ROS:  Performed and pertinent positives and negatives are included in the history, assessment and plan .  Exam: Kim assistant Filed Vitals:   07/18/13 1530  BP: 120/76  Height: _0  (1.6 m)  Weight: 146 lb (66.225 kg)   General appearance  Normal Skin grossly normal Head/Neck normal with no cervical or supraclavicular adenopathy thyroid normal Lungs  clear Cardiac RR, without RMG Abdominal  soft, nontender, without masses, organomegaly or hernia Breasts  examined lying and sitting. Left without masses, retractions, discharge or axillary adenopathy. Right status post reconstruction with implant without masses adenopathy or acute changes. Pelvic  Ext/BUS/vagina with generalized atrophic changes  Cervix atrophic flush with the upper vagina. Pap done  Uterus anteverted, normal size, shape and contour, midline and mobile nontender   Adnexa  Without masses or tenderness    Anus and perineum  Normal   Rectovaginal  Normal sphincter tone without palpated masses or tenderness.    Assessment/Plan:  63 y.o. [id number] female for annual exam.   1. Postmenopausal/atrophic genital changes. Without significant symptoms of vaginal dryness, dyspareunia, hot flushes or night sweats. No vaginal bleeding. Continue to monitor. Report any vaginal bleeding. 2. History of breast cancer 1994. Mammography 12/2012. I again reviewed BRCA testing with her and she developed breast cancer at age 63. No other family members with breast or ovarian cancer. We discussed the benefit both from a prophylactic surgery standpoint as well as a family member standpoint. Prior ultrasounds did not identify any ovarian tissue consistent with atrophic changes. The patient again  declines BRCA testing understanding the issues. 3. History of tongue cancer 2011.  Right node dissection with positive level III metastatic node with no extracapsular extension. Exam today NED. Continue to followup with her oncologist. 4. Osteopenia. Being followed by Dr. Reynaldo Minium. I do not have copies of the DEXA 's and she will continue to followup with him in reference to this. 5. Pap smear HPV negative 2014. Pap/HPV done today. Given her unusual cancer history will continue with annual cytology. 6. Colonoscopy reported 6 years ago. Repeat at their recommended interval. 7. Health maintenance. No routine blood work done as it is all done through Dr. Jacquiline Doe office. Followup one year, sooner as needed.   Note: This document was prepared with digital dictation and possible smart phrase technology. Any transcriptional errors that result from this process are unintentional.   [name] MD, 4:06 PM 07/18/2013

## 2013-07-18 NOTE — Addendum Note (Signed)
Addended by: Nelva Nay on: 07/18/2013 04:38 PM   Modules accepted: Orders

## 2013-07-18 NOTE — Patient Instructions (Signed)
Followup in one year for annual exam, sooner if any issues 

## 2013-07-19 LAB — URINALYSIS W MICROSCOPIC + REFLEX CULTURE
BACTERIA UA: NONE SEEN
BILIRUBIN URINE: NEGATIVE
Casts: NONE SEEN
Crystals: NONE SEEN
Glucose, UA: NEGATIVE mg/dL
Hgb urine dipstick: NEGATIVE
Ketones, ur: NEGATIVE mg/dL
Leukocytes, UA: NEGATIVE
Nitrite: NEGATIVE
PROTEIN: NEGATIVE mg/dL
SPECIFIC GRAVITY, URINE: 1.021 (ref 1.005–1.030)
SQUAMOUS EPITHELIAL / LPF: NONE SEEN
UROBILINOGEN UA: 0.2 mg/dL (ref 0.0–1.0)
pH: 6.5 (ref 5.0–8.0)

## 2013-09-06 ENCOUNTER — Other Ambulatory Visit (HOSPITAL_COMMUNITY): Payer: Self-pay | Admitting: Internal Medicine

## 2013-09-11 ENCOUNTER — Inpatient Hospital Stay (HOSPITAL_COMMUNITY): Admission: RE | Admit: 2013-09-11 | Payer: BC Managed Care – PPO | Source: Ambulatory Visit

## 2013-09-18 ENCOUNTER — Encounter (HOSPITAL_COMMUNITY): Payer: Self-pay

## 2013-09-18 ENCOUNTER — Ambulatory Visit (HOSPITAL_COMMUNITY)
Admission: RE | Admit: 2013-09-18 | Discharge: 2013-09-18 | Disposition: A | Discharge status: Home / Self Care | Payer: BC Managed Care – PPO | Source: Ambulatory Visit | Attending: Internal Medicine | Admitting: Internal Medicine

## 2013-09-18 DIAGNOSIS — M810 Age-related osteoporosis without current pathological fracture: Secondary | ICD-10-CM | POA: Insufficient documentation

## 2013-09-18 MED ORDER — ZOLEDRONIC ACID 5 MG/100ML IV SOLN
5.0000 mg | Freq: Once | INTRAVENOUS | Status: AC
  Administered 2013-09-18: 5 mg via INTRAVENOUS
  Filled 2013-09-18: qty 100

## 2013-09-18 MED ORDER — SODIUM CHLORIDE 0.9 % IV SOLN
INTRAVENOUS | Status: AC
  Administered 2013-09-18: 15:00:00 via INTRAVENOUS

## 2013-09-18 NOTE — Progress Notes (Signed)
Uneventful infusion of 1st RECLAST

## 2013-09-18 NOTE — Discharge Instructions (Signed)
RECLAST °Zoledronic Acid injection (Paget's Disease, Osteoporosis) °What is this medicine? °ZOLEDRONIC ACID (ZOE le dron ik AS id) lowers the amount of calcium loss from bone. It is used to treat Paget's disease and osteoporosis in women. °This medicine may be used for other purposes; ask your health care provider or pharmacist if you have questions. °COMMON BRAND NAME(S): Reclast, Zometa °What should I tell my health care provider before I take this medicine? °They need to know if you have any of these conditions: °-aspirin-sensitive asthma °-cancer, especially if you are receiving medicines used to treat cancer °-dental disease or wear dentures °-infection °-kidney disease °-low levels of calcium in the blood °-past surgery on the parathyroid gland or intestines °-receiving corticosteroids like dexamethasone or prednisone °-an unusual or allergic reaction to zoledronic acid, other medicines, foods, dyes, or preservatives °-pregnant or trying to get pregnant °-breast-feeding °How should I use this medicine? °This medicine is for infusion into a vein. It is given by a health care professional in a hospital or clinic setting. °Talk to your pediatrician regarding the use of this medicine in children. This medicine is not approved for use in children. °Overdosage: If you think you have taken too much of this medicine contact a poison control center or emergency room at once. °NOTE: This medicine is only for you. Do not share this medicine with others. °What if I miss a dose? °It is important not to miss your dose. Call your doctor or health care professional if you are unable to keep an appointment. °What may interact with this medicine? °-certain antibiotics given by injection °-NSAIDs, medicines for pain and inflammation, like ibuprofen or naproxen °-some diuretics like bumetanide, furosemide °-teriparatide °This list may not describe all possible interactions. Give your health care provider a list of all the  medicines, herbs, non-prescription drugs, or dietary supplements you use. Also tell them if you smoke, drink alcohol, or use illegal drugs. Some items may interact with your medicine. °What should I watch for while using this medicine? °Visit your doctor or health care professional for regular checkups. It may be some time before you see the benefit from this medicine. Do not stop taking your medicine unless your doctor tells you to. Your doctor may order blood tests or other tests to see how you are doing. °Women should inform their doctor if they wish to become pregnant or think they might be pregnant. There is a potential for serious side effects to an unborn child. Talk to your health care professional or pharmacist for more information. °You should make sure that you get enough calcium and vitamin D while you are taking this medicine. Discuss the foods you eat and the vitamins you take with your health care professional. °Some people who take this medicine have severe bone, joint, and/or muscle pain. This medicine may also increase your risk for jaw problems or a broken thigh bone. Tell your doctor right away if you have severe pain in your jaw, bones, joints, or muscles. Tell your doctor if you have any pain that does not go away or that gets worse. °Tell your dentist and dental surgeon that you are taking this medicine. You should not have major dental surgery while on this medicine. See your dentist to have a dental exam and fix any dental problems before starting this medicine. Take good care of your teeth while on this medicine. Make sure you see your dentist for regular follow-up appointments. °What side effects may I notice from receiving this   medicine? °Side effects that you should report to your doctor or health care professional as soon as possible: °-allergic reactions like skin rash, itching or hives, swelling of the face, lips, or tongue °-anxiety, confusion, or depression °-breathing  problems °-changes in vision °-eye pain °-feeling faint or lightheaded, falls °-jaw pain, especially after dental work °-mouth sores °-muscle cramps, stiffness, or weakness °-trouble passing urine or change in the amount of urine °Side effects that usually do not require medical attention (report to your doctor or health care professional if they continue or are bothersome): °-bone, joint, or muscle pain °-constipation °-diarrhea °-fever °-hair loss °-irritation at site where injected °-loss of appetite °-nausea, vomiting °-stomach upset °-trouble sleeping °-trouble swallowing °-weak or tired °This list may not describe all possible side effects. Call your doctor for medical advice about side effects. You may report side effects to FDA at 1-800-FDA-1088. °Where should I keep my medicine? °This drug is given in a hospital or clinic and will not be stored at home. °NOTE: This sheet is a summary. It may not cover all possible information. If you have questions about this medicine, talk to your doctor, pharmacist, or health care provider. °© 2014, [employer]. (2012-09-24 10:03:48) °Osteoporosis °Throughout your life, your body breaks down old bone and replaces it with new bone. As you get older, your body does not replace bone as quickly as it breaks it down. By the age of 30 years, most people begin to gradually lose bone because of the imbalance between bone loss and replacement. Some people lose more bone than others. Bone loss beyond a specified normal degree is considered osteoporosis.  °Osteoporosis affects the strength and durability of your bones. The inside of the ends of your bones and your flat bones, like the bones of your pelvis, look like honeycomb, filled with tiny open spaces. As bone loss occurs, your bones become less dense. This means that the open spaces inside your bones become bigger and the walls between these spaces become thinner. This makes your bones weaker. Bones of a person with  osteoporosis can become so weak that they can break (fracture) during minor accidents, such as a simple fall. °CAUSES  °The following factors have been associated with the development of osteoporosis: °· Smoking. °· Drinking more than 2 alcoholic drinks several days per week. °· Long-term use of certain medicines: °· Corticosteroids. °· Chemotherapy medicines. °· Thyroid medicines. °· Antiepileptic medicines. °· Gonadal hormone suppression medicine. °· Immunosuppression medicine. °· Being underweight. °· Lack of physical activity. °· Lack of exposure to the sun. This can lead to vitamin D deficiency. °· Certain medical conditions: °· Certain inflammatory bowel diseases, such as Crohn disease and ulcerative colitis. °· Diabetes. °· Hyperthyroidism. °· Hyperparathyroidism. °RISK FACTORS °Anyone can develop osteoporosis. However, the following factors can increase your risk of developing osteoporosis: °· Gender Women are at higher risk than men. °· Age Being older than 50 years increases your risk. °· Ethnicity White and Asian people have an increased risk. °· Weight Being extremely underweight can increase your risk of osteoporosis. °· Family history of osteoporosis Having a family member who has developed osteoporosis can increase your risk. °SYMPTOMS  °Usually, people with osteoporosis have no symptoms.  °DIAGNOSIS  °Signs during a physical exam that may prompt your caregiver to suspect osteoporosis include: °· Decreased height. This is usually caused by the compression of the bones that form your spine (vertebrae) because they have weakened and become fractured. °· A curving or rounding of   the upper back (kyphosis). To confirm signs of osteoporosis, your caregiver may request a procedure that uses 2 low-dose X-ray beams with different levels of energy to measure your bone mineral density (dual-energy X-ray absorptiometry [DXA]). Also, your caregiver may check your level of vitamin D. TREATMENT  The goal of  osteoporosis treatment is to strengthen bones in order to decrease the risk of bone fractures. There are different types of medicines available to help achieve this goal. Some of these medicines work by slowing the processes of bone loss. Some medicines work by increasing bone density. Treatment also involves making sure that your levels of calcium and vitamin D are adequate. PREVENTION  There are things you can do to help prevent osteoporosis. Adequate intake of calcium and vitamin D can help you achieve optimal bone mineral density. Regular exercise can also help, especially resistance and weight-bearing activities. If you smoke, quitting smoking is an important part of osteoporosis prevention. MAKE SURE YOU:  Understand these instructions.  Will watch your condition.  Will get help right away if you are not doing well or get worse. FOR MORE INFORMATION www.osteo.org and [employer].ee Document Released: 01/19/2005 Document Revised: 08/06/2012 Document Reviewed: 03/26/2011 [facility] Patient Information 2014 [place], [place].

## 2013-12-09 ENCOUNTER — Other Ambulatory Visit: Payer: Self-pay

## 2013-12-09 DIAGNOSIS — Z1231 Encounter for screening mammogram for malignant neoplasm of breast: Secondary | ICD-10-CM

## 2013-12-09 DIAGNOSIS — Z9011 Acquired absence of right breast and nipple: Secondary | ICD-10-CM

## 2014-01-10 ENCOUNTER — Encounter (INDEPENDENT_AMBULATORY_CARE_PROVIDER_SITE_OTHER): Payer: Self-pay

## 2014-01-10 ENCOUNTER — Ambulatory Visit
Admission: RE | Admit: 2014-01-10 | Discharge: 2014-01-10 | Disposition: A | Discharge status: Home / Self Care | Payer: BC Managed Care – PPO | Source: Ambulatory Visit

## 2014-01-10 DIAGNOSIS — Z1231 Encounter for screening mammogram for malignant neoplasm of breast: Secondary | ICD-10-CM

## 2014-01-10 DIAGNOSIS — Z9011 Acquired absence of right breast and nipple: Secondary | ICD-10-CM

## 2014-02-24 ENCOUNTER — Encounter (HOSPITAL_COMMUNITY): Payer: Self-pay

## 2014-09-02 ENCOUNTER — Ambulatory Visit (INDEPENDENT_AMBULATORY_CARE_PROVIDER_SITE_OTHER): Payer: BC Managed Care – PPO | Admitting: Gynecology

## 2014-09-02 ENCOUNTER — Other Ambulatory Visit (HOSPITAL_COMMUNITY)
Admission: RE | Admit: 2014-09-02 | Discharge: 2014-09-02 | Disposition: A | Discharge status: Home / Self Care | Payer: BC Managed Care – PPO | Source: Ambulatory Visit | Attending: Gynecology | Admitting: Gynecology

## 2014-09-02 ENCOUNTER — Encounter: Payer: Self-pay | Admitting: Gynecology

## 2014-09-02 VITALS — BP 122/78 | Ht 63.5 in | Wt 145.0 lb

## 2014-09-02 DIAGNOSIS — N952 Postmenopausal atrophic vaginitis: Secondary | ICD-10-CM

## 2014-09-02 DIAGNOSIS — M810 Age-related osteoporosis without current pathological fracture: Secondary | ICD-10-CM | POA: Diagnosis not present

## 2014-09-02 DIAGNOSIS — Z01419 Encounter for gynecological examination (general) (routine) without abnormal findings: Secondary | ICD-10-CM | POA: Diagnosis not present

## 2014-09-02 NOTE — Patient Instructions (Signed)
You may obtain a copy of any labs that were done today by logging onto MyChart as outlined in the instructions provided with your AVS (after visit summary). The office will not call with normal lab results but certainly if there are any significant abnormalities then we will contact you.   Health Maintenance, Female A healthy lifestyle and preventative care can promote health and wellness.  Maintain regular health, dental, and eye exams.  Eat a healthy diet. Foods like vegetables, fruits, whole grains, low-fat dairy products, and lean protein foods contain the nutrients you need without too many calories. Decrease your intake of foods high in solid fats, added sugars, and salt. Get information about a proper diet from your caregiver, if necessary.  Regular physical exercise is one of the most important things you can do for your health. Most adults should get at least 150 minutes of moderate-intensity exercise (any activity that increases your heart rate and causes you to sweat) each week. In addition, most adults need muscle-strengthening exercises on 2 or more days a week.   Maintain a healthy weight. The body mass index (BMI) is a screening tool to identify possible weight problems. It provides an estimate of body fat based on height and weight. Your caregiver can help determine your BMI, and can help you achieve or maintain a healthy weight. For adults 20 years and older:  A BMI below 18.5 is considered underweight.  A BMI of 18.5 to 24.9 is normal.  A BMI of 25 to 29.9 is considered overweight.  A BMI of 30 and above is considered obese.  Maintain normal blood lipids and cholesterol by exercising and minimizing your intake of saturated fat. Eat a balanced diet with plenty of fruits and vegetables. Blood tests for lipids and cholesterol should begin at age 61 and be repeated every 5 years. If your lipid or cholesterol levels are high, you are over 50, or you are a high risk for heart  disease, you may need your cholesterol levels checked more frequently.Ongoing high lipid and cholesterol levels should be treated with medicines if diet and exercise are not effective.  If you smoke, find out from your caregiver how to quit. If you do not use tobacco, do not start.  Lung cancer screening is recommended for adults aged 33 80 years who are at high risk for developing lung cancer because of a history of smoking. Yearly low-dose computed tomography (CT) is recommended for people who have at least a 30-pack-year history of smoking and are a current smoker or have quit within the past 15 years. A pack year of smoking is smoking an average of 1 pack of cigarettes a day for 1 year (for example: 1 pack a day for 30 years or 2 packs a day for 15 years). Yearly screening should continue until the smoker has stopped smoking for at least 15 years. Yearly screening should also be stopped for people who develop a health problem that would prevent them from having lung cancer treatment.  If you are pregnant, do not drink alcohol. If you are breastfeeding, be very cautious about drinking alcohol. If you are not pregnant and choose to drink alcohol, do not exceed 1 drink per day. One drink is considered to be 12 ounces (355 mL) of beer, 5 ounces (148 mL) of wine, or 1.5 ounces (44 mL) of liquor.  Avoid use of street drugs. Do not share needles with anyone. Ask for help if you need support or instructions about stopping  the use of drugs.  High blood pressure causes heart disease and increases the risk of stroke. Blood pressure should be checked at least every 1 to 2 years. Ongoing high blood pressure should be treated with medicines, if weight loss and exercise are not effective.  If you are 59 to 64 years old, ask your caregiver if you should take aspirin to prevent strokes.  Diabetes screening involves taking a blood sample to check your fasting blood sugar level. This should be done once every 3  years, after age 91, if you are within normal weight and without risk factors for diabetes. Testing should be considered at a younger age or be carried out more frequently if you are overweight and have at least 1 risk factor for diabetes.  Breast cancer screening is essential preventative care for women. You should practice "breast self-awareness." This means understanding the normal appearance and feel of your breasts and may include breast self-examination. Any changes detected, no matter how small, should be reported to a caregiver. Women in their 66s and 30s should have a clinical breast exam (CBE) by a caregiver as part of a regular health exam every 1 to 3 years. After age 101, women should have a CBE every year. Starting at age 100, women should consider having a mammogram (breast X-ray) every year. Women who have a family history of breast cancer should talk to their caregiver about genetic screening. Women at a high risk of breast cancer should talk to their caregiver about having an MRI and a mammogram every year.  Breast cancer gene (BRCA)-related cancer risk assessment is recommended for women who have family members with BRCA-related cancers. BRCA-related cancers include breast, ovarian, tubal, and peritoneal cancers. Having family members with these cancers may be associated with an increased risk for harmful changes (mutations) in the breast cancer genes BRCA1 and BRCA2. Results of the assessment will determine the need for genetic counseling and BRCA1 and BRCA2 testing.  The Pap test is a screening test for cervical cancer. Women should have a Pap test starting at age 57. Between ages 25 and 35, Pap tests should be repeated every 2 years. Beginning at age 37, you should have a Pap test every 3 years as long as the past 3 Pap tests have been normal. If you had a hysterectomy for a problem that was not cancer or a condition that could lead to cancer, then you no longer need Pap tests. If you are  between ages 50 and 76, and you have had normal Pap tests going back 10 years, you no longer need Pap tests. If you have had past treatment for cervical cancer or a condition that could lead to cancer, you need Pap tests and screening for cancer for at least 20 years after your treatment. If Pap tests have been discontinued, risk factors (such as a new sexual partner) need to be reassessed to determine if screening should be resumed. Some women have medical problems that increase the chance of getting cervical cancer. In these cases, your caregiver may recommend more frequent screening and Pap tests.  The human papillomavirus (HPV) test is an additional test that may be used for cervical cancer screening. The HPV test looks for the virus that can cause the cell changes on the cervix. The cells collected during the Pap test can be tested for HPV. The HPV test could be used to screen women aged 44 years and older, and should be used in women of any age  who have unclear Pap test results. After the age of 55, women should have HPV testing at the same frequency as a Pap test.  Colorectal cancer can be detected and often prevented. Most routine colorectal cancer screening begins at the age of 44 and continues through age 20. However, your caregiver may recommend screening at an earlier age if you have risk factors for colon cancer. On a yearly basis, your caregiver may provide home test kits to check for hidden blood in the stool. Use of a small camera at the end of a tube, to directly examine the colon (sigmoidoscopy or colonoscopy), can detect the earliest forms of colorectal cancer. Talk to your caregiver about this at age 86, when routine screening begins. Direct examination of the colon should be repeated every 5 to 10 years through age 13, unless early forms of pre-cancerous polyps or small growths are found.  Hepatitis C blood testing is recommended for all people born from [birth year] through 1964 and any  individual with known risks for hepatitis C.  Practice safe sex. Use condoms and avoid high-risk sexual practices to reduce the spread of sexually transmitted infections (STIs). Sexually active women aged 36 and younger should be checked for Chlamydia, which is a common sexually transmitted infection. Older women with new or multiple partners should also be tested for Chlamydia. Testing for other STIs is recommended if you are sexually active and at increased risk.  Osteoporosis is a disease in which the bones lose minerals and strength with aging. This can result in serious bone fractures. The risk of osteoporosis can be identified using a bone density scan. Women ages 20 and over and women at risk for fractures or osteoporosis should discuss screening with their caregivers. Ask your caregiver whether you should be taking a calcium supplement or vitamin D to reduce the rate of osteoporosis.  Menopause can be associated with physical symptoms and risks. Hormone replacement therapy is available to decrease symptoms and risks. You should talk to your caregiver about whether hormone replacement therapy is right for you.  Use sunscreen. Apply sunscreen liberally and repeatedly throughout the day. You should seek shade when your shadow is shorter than you. Protect yourself by wearing long sleeves, pants, a wide-brimmed hat, and sunglasses year round, whenever you are outdoors.  Notify your caregiver of new moles or changes in moles, especially if there is a change in shape or color. Also notify your caregiver if a mole is larger than the size of a pencil eraser.  Stay current with your immunizations. Document Released: 10/25/2010 Document Revised: 08/06/2012 Document Reviewed: 10/25/2010 [facility] Patient Information 2014 [place].

## 2014-09-02 NOTE — Progress Notes (Signed)
Diane Macdonald 30-Oct-1950 [id number]        64 y.o.  [id number] for annual exam.  Several issues noted below.  Past medical history,surgical history, problem list, medications, allergies, family history and social history were all reviewed and documented as reviewed in the EPIC chart.  ROS:  Performed with pertinent positives and negatives included in the history, assessment and plan.   Additional significant findings :  none   Exam: Kim Counsellor Vitals:   09/02/14 1522  BP: 122/78  Height: 5' 3.5" (1.613 m)  Weight: 145 lb (65.772 kg)   General appearance:  Normal affect, orientation and appearance. Skin: Grossly normal HEENT: Without gross lesions.  No cervical or supraclavicular adenopathy. Thyroid normal.  Lungs:  Clear without wheezing, rales or rhonchi Cardiac: RR, without RMG Abdominal:  Soft, nontender, without masses, guarding, rebound, organomegaly or hernia Breasts:  Examined lying and sitting. Left without masses, retractions, discharge or axillary adenopathy.  Right status post reconstruction with implant. No masses, adenopathy or acute changes. Pelvic:  Ext/BUS/vagina with atrophic changes  Cervix atrophic flush with the upper vagina. Pap smear done  Uterus axial to anteverted, normal size, shape and contour, midline and mobile nontender   Adnexa  Without masses or tenderness    Anus and perineum  Normal   Rectovaginal  Normal sphincter tone without palpated masses or tenderness.    Assessment/Plan:  64 y.o. [id number] female for annual exam.   1. Postmenopausal/atrophic genital changes. Without significant symptoms of hot flushes, night sweats, vaginal dryness. No vaginal bleeding. Continue to monitor and report any vaginal bleeding. 2. History of breast cancer 1994. Exam NED.  Mammography 12/2013. Continue with annual mammography. SBE monthly reviewed. I have previously discussed genetic testing and she has declined. 3. History of tongue cancer 2011. Right node  dissection with positive level III metastatic node with no extra capsular extension. Exam today NED. Continue to follow up with her oncologist. 4. Osteoporosis. Started on Reclast last year by Dr. Reynaldo Minium. Will continue to follow up with him in reference to this. 5. Pap smear 2015. Pap smear done today. Will continue with annual cytology given her unusual cancer history. 6. Colonoscopy 2014. Repeat at their recommended interval. 7. Health maintenance. No routine blood work done as she reports this done at her primary physician's office.  Follow up 1 year, sooner as needed.     [name] MD, 3:52 PM 09/02/2014

## 2014-09-02 NOTE — Addendum Note (Signed)
Addended by: Nelva Nay on: 09/02/2014 04:00 PM   Modules accepted: Orders

## 2014-09-04 LAB — CYTOLOGY - PAP

## 2014-09-25 ENCOUNTER — Encounter (HOSPITAL_COMMUNITY): Payer: Self-pay

## 2014-09-25 ENCOUNTER — Other Ambulatory Visit (HOSPITAL_COMMUNITY): Payer: Self-pay | Admitting: Internal Medicine

## 2014-09-25 ENCOUNTER — Ambulatory Visit (HOSPITAL_COMMUNITY)
Admission: RE | Admit: 2014-09-25 | Discharge: 2014-09-25 | Disposition: A | Discharge status: Home / Self Care | Payer: BC Managed Care – PPO | Source: Ambulatory Visit | Attending: Internal Medicine | Admitting: Internal Medicine

## 2014-09-25 DIAGNOSIS — M810 Age-related osteoporosis without current pathological fracture: Secondary | ICD-10-CM | POA: Diagnosis not present

## 2014-09-25 MED ORDER — ZOLEDRONIC ACID 5 MG/100ML IV SOLN
5.0000 mg | Freq: Once | INTRAVENOUS | Status: AC
  Administered 2014-09-25: 5 mg via INTRAVENOUS
  Filled 2014-09-25: qty 100

## 2014-09-25 MED ORDER — SODIUM CHLORIDE 0.9 % IV SOLN
Freq: Once | INTRAVENOUS | Status: AC
  Administered 2014-09-25: 12:00:00 via INTRAVENOUS

## 2014-09-25 NOTE — Discharge Instructions (Signed)

## 2014-09-25 NOTE — Progress Notes (Signed)
Pt receiving reclast today.  Pt states her MD told her she didn't have to take Vit.D or calcium because her blood work was within normal range.  Pt does take a multiple vitamin.  Pt given d/c instructions regarding reclast.

## 2014-11-25 ENCOUNTER — Other Ambulatory Visit: Payer: Self-pay

## 2014-11-25 DIAGNOSIS — Z1231 Encounter for screening mammogram for malignant neoplasm of breast: Secondary | ICD-10-CM

## 2015-01-12 ENCOUNTER — Ambulatory Visit
Admission: RE | Admit: 2015-01-12 | Discharge: 2015-01-12 | Disposition: A | Discharge status: Home / Self Care | Payer: BC Managed Care – PPO | Source: Ambulatory Visit

## 2015-01-12 DIAGNOSIS — Z1231 Encounter for screening mammogram for malignant neoplasm of breast: Secondary | ICD-10-CM

## 2015-02-05 ENCOUNTER — Encounter: Payer: Self-pay | Admitting: Internal Medicine

## 2015-04-01 ENCOUNTER — Encounter: Payer: Self-pay | Admitting: Internal Medicine

## 2015-04-10 ENCOUNTER — Encounter: Payer: Self-pay | Admitting: Internal Medicine

## 2015-04-10 ENCOUNTER — Ambulatory Visit (AMBULATORY_SURGERY_CENTER): Payer: Self-pay | Admitting: *Deleted

## 2015-04-10 VITALS — Ht 64.0 in | Wt 147.0 lb

## 2015-04-10 DIAGNOSIS — Z86010 Personal history of colonic polyps: Secondary | ICD-10-CM

## 2015-04-10 MED ORDER — NA SULFATE-K SULFATE-MG SULF 17.5-3.13-1.6 GM/177ML PO SOLN: 1.0000 | Freq: Once | ORAL | Status: DC

## 2015-04-10 NOTE — Progress Notes (Signed)
No egg or soy allergy known to patient  No issues with past sedation with any surgeries  or procedures, no intubation problems  No diet pills No home 02 use per patient   emmi declined   

## 2015-04-24 ENCOUNTER — Encounter: Payer: Self-pay | Admitting: Internal Medicine

## 2015-04-24 ENCOUNTER — Ambulatory Visit (AMBULATORY_SURGERY_CENTER): Payer: BC Managed Care – PPO | Admitting: Internal Medicine

## 2015-04-24 VITALS — BP 144/62 | HR 61 | Temp 97.4°F | Resp 13 | Ht 64.0 in | Wt 147.0 lb

## 2015-04-24 DIAGNOSIS — Z86010 Personal history of colonic polyps: Secondary | ICD-10-CM

## 2015-04-24 MED ORDER — SODIUM CHLORIDE 0.9 % IV SOLN: 500.0000 mL | INTRAVENOUS | Status: DC

## 2015-04-24 NOTE — Progress Notes (Signed)
To recovery, report to Mirts, RN, VSS. 

## 2015-04-24 NOTE — Patient Instructions (Signed)
YOU HAD AN ENDOSCOPIC PROCEDURE TODAY AT THE [facility]:   Refer to the procedure report that was given to you for any specific questions about what was found during the examination.  If the procedure report does not answer your questions, please call your gastroenterologist to clarify.  If you requested that your care partner not be given the details of your procedure findings, then the procedure report has been included in a sealed envelope for you to review at your convenience later.  YOU SHOULD EXPECT: Some feelings of bloating in the abdomen. Passage of more gas than usual.  Walking can help get rid of the air that was put into your GI tract during the procedure and reduce the bloating. If you had a lower endoscopy (such as a colonoscopy or flexible sigmoidoscopy) you may notice spotting of blood in your stool or on the toilet paper. If you underwent a bowel prep for your procedure, you may not have a normal bowel movement for a few days.  Please Note:  You might notice some irritation and congestion in your nose or some drainage.  This is from the oxygen used during your procedure.  There is no need for concern and it should clear up in a day or so.  SYMPTOMS TO REPORT IMMEDIATELY:   Following lower endoscopy (colonoscopy or flexible sigmoidoscopy):  Excessive amounts of blood in the stool  Significant tenderness or worsening of abdominal pains  Swelling of the abdomen that is new, acute  Fever of 100F or higher  For urgent or emergent issues, a gastroenterologist can be reached at any hour by calling [phone].   DIET: Your first meal following the procedure should be a small meal and then it is ok to progress to your normal diet. Heavy or fried foods are harder to digest and may make you feel nauseous or bloated.  Likewise, meals heavy in dairy and vegetables can increase bloating.  Drink plenty of fluids but you should avoid alcoholic beverages for 24  hours.  ACTIVITY:  You should plan to take it easy for the rest of today and you should NOT DRIVE or use heavy machinery until tomorrow (because of the sedation medicines used during the test).    FOLLOW UP: Our staff will call the number listed on your records the next business day following your procedure to check on you and address any questions or concerns that you may have regarding the information given to you following your procedure. If we do not reach you, we will leave a message.  However, if you are feeling well and you are not experiencing any problems, there is no need to return our call.  We will assume that you have returned to your regular daily activities without incident.  If any biopsies were taken you will be contacted by phone or by letter within the next 1-3 weeks.  Please call us at [phone] if you have not heard about the biopsies in 3 weeks.    SIGNATURES/CONFIDENTIALITY: You and/or your care partner have signed paperwork which will be entered into your electronic medical record.  These signatures attest to the fact that that the information above on your After Visit Summary has been reviewed and is understood.  Full responsibility of the confidentiality of this discharge information lies with you and/or your care-partner.  Diverticulosis, high fiber diet-handouts given   

## 2015-04-24 NOTE — Op Note (Signed)
[place]  [address] [place], [id number]   COLONOSCOPY PROCEDURE REPORT  PATIENT: Diane Macdonald, Diane Macdonald  MR#: [id number] BIRTHDATE: 08-12-1950 , 64  yrs. old GENDER: female ENDOSCOPIST: Eustace Quail, MD REFERRED [id number] Program Recall PROCEDURE DATE:  04/24/2015 PROCEDURE:   Colonoscopy, surveillance First Screening Colonoscopy - Avg.  risk and is 50 yrs.  old or older - No.  Prior Negative Screening - Now for repeat screening. N/A  History of Adenoma - Now for follow-up colonoscopy & has been > or = to 3 yrs.  Yes hx of adenoma.  Has been 3 or more years since last colonoscopy.  Polyps removed today? No Recommend repeat exam, <10 yrs? No ASA CLASS:   Class III INDICATIONS:Surveillance due to prior colonic neoplasia and PH Colon Adenoma. Previous exam 2005 (negative); 2008 (tubular adenoma). MEDICATIONS: Monitored anesthesia care and Propofol 170 mg IV  DESCRIPTION OF PROCEDURE:   After the risks benefits and alternatives of the procedure were thoroughly explained, informed consent was obtained.  The digital rectal exam revealed no abnormalities of the rectum.   The LB [id number]  endoscope was introduced through the anus and advanced to the cecum, which was identified by both the appendix and ileocecal valve. No adverse events experienced.   The quality of the prep was excellent. (Suprep was used)  The instrument was then slowly withdrawn as the colon was fully examined. Estimated blood loss is zero unless otherwise noted in this procedure report.     COLON FINDINGS: There was mild diverticulosis noted in the sigmoid colon.   The examination was otherwise normal.  Retroflexed views revealed no abnormalities. The time to cecum = 2.3 Withdrawal time = 5.8   The scope was withdrawn and the procedure completed. COMPLICATIONS: There were no immediate complications.  ENDOSCOPIC IMPRESSION: 1.   Mild diverticulosis was noted in the sigmoid  colon 2.   The examination was otherwise normal  RECOMMENDATIONS: 1. Continue current colorectal surveillance recommendations with a repeat colonoscopy in 10 years.  eSigned:  Eustace Quail, MD 04/24/2015 11:40 AM   cc: The Patient and Burnard Bunting, MD

## 2015-04-28 ENCOUNTER — Telehealth: Payer: Self-pay | Admitting: *Deleted

## 2015-04-28 NOTE — Telephone Encounter (Signed)
No answer. Name identifier. Message left to call if questions or concerns. 

## 2015-09-03 ENCOUNTER — Encounter: Payer: Self-pay | Admitting: Gynecology

## 2015-09-03 ENCOUNTER — Ambulatory Visit (INDEPENDENT_AMBULATORY_CARE_PROVIDER_SITE_OTHER): Payer: BC Managed Care – PPO | Admitting: Gynecology

## 2015-09-03 VITALS — BP 120/76 | Ht 64.0 in | Wt 147.0 lb

## 2015-09-03 DIAGNOSIS — C50912 Malignant neoplasm of unspecified site of left female breast: Secondary | ICD-10-CM | POA: Diagnosis not present

## 2015-09-03 DIAGNOSIS — Z01419 Encounter for gynecological examination (general) (routine) without abnormal findings: Secondary | ICD-10-CM

## 2015-09-03 DIAGNOSIS — C029 Malignant neoplasm of tongue, unspecified: Secondary | ICD-10-CM

## 2015-09-03 DIAGNOSIS — N952 Postmenopausal atrophic vaginitis: Secondary | ICD-10-CM

## 2015-09-03 DIAGNOSIS — M810 Age-related osteoporosis without current pathological fracture: Secondary | ICD-10-CM

## 2015-09-03 NOTE — Addendum Note (Signed)
Addended by: Nelva Nay on: 09/03/2015 04:15 PM   Modules accepted: Orders, SmartSet

## 2015-09-03 NOTE — Patient Instructions (Signed)

## 2015-09-03 NOTE — Progress Notes (Signed)
    Diane Macdonald [birth year] [id number]        65 y.o.  [id number]  for annual exam.  Several issues noted below.  Past medical history,surgical history, problem list, medications, allergies, family history and social history were all reviewed and documented as reviewed in the EPIC chart.  ROS:  Performed with pertinent positives and negatives included in the history, assessment and plan.   Additional significant findings :  none   Exam: Caryn Bee assistant Filed Vitals:   09/03/15 1520  BP: 120/76  Height: 5\' 4"  (1.626 m)  Weight: 147 lb (66.679 kg)   General appearance:  Normal affect, orientation and appearance. Skin: Grossly normal HEENT: Without gross lesions.  No cervical or supraclavicular adenopathy. Thyroid normal.  Lungs:  Clear without wheezing, rales or rhonchi Cardiac: RR, without RMG Abdominal:  Soft, nontender, without masses, guarding, rebound, organomegaly or hernia Breasts:  Examined lying and sitting. Left without masses, retractions, discharge or axillary adenopathy.  Right status post mastectomy and reconstruction with implant. No masses, adenopathy or acute changes. Pelvic:  Ext/BUS/vagina with atrophic changes  Cervix atrophic flush with the upper vagina. Pap smear done  Uterus anteverted, normal size, shape and contour, midline and mobile nontender   Adnexa without masses or tenderness    Anus and perineum normal   Rectovaginal normal sphincter tone without palpated masses or tenderness.    Assessment/Plan:  65 y.o. [id number] female for annual exam.   1. Postmenopausal/atrophic genital changes. Without significant hot flushes, night sweats, vaginal dryness or any vaginal bleeding. Continue to monitor report any issues or bleeding. 2. History of breast cancer 1994. Exam NED. Mammography 12/2014. Continue with annual mammography. SBE monthly reviewed. Had discussed genetic testing in the past and she declines. 3. History of tongue cancer 2011. Exam NED.  Continue to follow up with oncology. 4. Osteoporosis. Continues on Reclast by Dr. Reynaldo Minium. She'll continue to follow up with him for this and for her  Bone densities. 5. Pap smear 2016. Pap smear done today. Will continue with annual Pap smears given her unusual cancer history. 6. Colonoscopy 2016. Repeat at their recommended interval. 7. Health maintenance. No routine lab work done as patient does this through her primary physician's office. Follow up 1 year, sooner as needed.   [name] MD, 3:52 PM 09/03/2015

## 2015-09-04 LAB — PAP IG W/ RFLX HPV ASCU

## 2015-09-10 ENCOUNTER — Ambulatory Visit (HOSPITAL_COMMUNITY): Payer: BC Managed Care – PPO

## 2015-09-22 ENCOUNTER — Other Ambulatory Visit (HOSPITAL_COMMUNITY): Payer: Self-pay | Admitting: *Deleted

## 2015-09-23 ENCOUNTER — Ambulatory Visit (HOSPITAL_COMMUNITY)
Admission: RE | Admit: 2015-09-23 | Discharge: 2015-09-23 | Disposition: A | Discharge status: Home / Self Care | Payer: BC Managed Care – PPO | Source: Ambulatory Visit | Attending: Internal Medicine | Admitting: Internal Medicine

## 2015-09-23 DIAGNOSIS — M810 Age-related osteoporosis without current pathological fracture: Secondary | ICD-10-CM | POA: Diagnosis not present

## 2015-09-23 MED ORDER — ZOLEDRONIC ACID 5 MG/100ML IV SOLN
INTRAVENOUS | Status: AC
  Filled 2015-09-23: qty 100

## 2015-09-23 MED ORDER — ZOLEDRONIC ACID 5 MG/100ML IV SOLN
5.0000 mg | Freq: Once | INTRAVENOUS | Status: AC
  Administered 2015-09-23: 5 mg via INTRAVENOUS

## 2015-10-22 ENCOUNTER — Telehealth: Payer: Self-pay | Admitting: *Deleted

## 2015-10-22 NOTE — Telephone Encounter (Signed)
Pt left message in triage requesting recent pap results from office visit 09/03/15, I called pt back and had to left message for her to call.

## 2015-10-22 NOTE — Telephone Encounter (Signed)
Pt aware of normal results.

## 2015-11-24 ENCOUNTER — Other Ambulatory Visit: Payer: Self-pay | Admitting: Gynecology

## 2015-11-24 DIAGNOSIS — Z9011 Acquired absence of right breast and nipple: Secondary | ICD-10-CM

## 2015-11-24 DIAGNOSIS — Z1231 Encounter for screening mammogram for malignant neoplasm of breast: Secondary | ICD-10-CM

## 2016-01-13 ENCOUNTER — Ambulatory Visit
Admission: RE | Admit: 2016-01-13 | Discharge: 2016-01-13 | Disposition: A | Discharge status: Home / Self Care | Payer: BC Managed Care – PPO | Source: Ambulatory Visit | Attending: Gynecology | Admitting: Gynecology

## 2016-01-13 DIAGNOSIS — Z9011 Acquired absence of right breast and nipple: Secondary | ICD-10-CM

## 2016-01-13 DIAGNOSIS — Z1231 Encounter for screening mammogram for malignant neoplasm of breast: Secondary | ICD-10-CM

## 2016-09-06 ENCOUNTER — Encounter: Payer: Self-pay | Admitting: Gynecology

## 2016-09-06 ENCOUNTER — Ambulatory Visit (INDEPENDENT_AMBULATORY_CARE_PROVIDER_SITE_OTHER): Payer: Medicare Other | Admitting: Gynecology

## 2016-09-06 VITALS — BP 120/74 | Ht 63.0 in | Wt 144.0 lb

## 2016-09-06 DIAGNOSIS — N952 Postmenopausal atrophic vaginitis: Secondary | ICD-10-CM

## 2016-09-06 DIAGNOSIS — Z01411 Encounter for gynecological examination (general) (routine) with abnormal findings: Secondary | ICD-10-CM

## 2016-09-06 DIAGNOSIS — C50911 Malignant neoplasm of unspecified site of right female breast: Secondary | ICD-10-CM

## 2016-09-06 NOTE — Patient Instructions (Signed)
Follow up for annual exam in one year 

## 2016-09-06 NOTE — Progress Notes (Signed)
    SHANZAY HEPWORTH [birth year] [id number]        66 y.o.  [id number] for annual exam.    Past medical history,surgical history, problem list, medications, allergies, family history and social history were all reviewed and documented as reviewed in the EPIC chart.  ROS:  Performed with pertinent positives and negatives included in the history, assessment and plan.   Additional significant findings :  None   Exam: Caryn Bee assistant Vitals:   09/06/16 1547  BP: 120/74  Weight: 144 lb (65.3 kg)  Height: 5\' 3"  (1.6 m)   Body mass index is 25.51 kg/m.  General appearance:  Normal affect, orientation and appearance. Skin: Grossly normal HEENT: Without gross lesions.  No cervical or supraclavicular adenopathy. Thyroid normal.  Lungs:  Clear without wheezing, rales or rhonchi Cardiac: RR, without RMG Abdominal:  Soft, nontender, without masses, guarding, rebound, organomegaly or hernia Breasts:  Examined lying and sitting. Left without masses, retractions, discharge or axillary adenopathy. Right status post mastectomy with reconstruction and implant. No masses, adenopathy or acute changes.  Pelvic:  Ext, BUS, Vagina: With atrophic changes  Cervix: With atrophic changes. Pap smear done  Uterus: Anteverted, normal size, shape and contour, midline and mobile nontender   Adnexa: Without masses or tenderness    Anus and perineum: Normal   Rectovaginal: Normal sphincter tone without palpated masses or tenderness.    Assessment/Plan:  66 y.o. [id number] female for annual exam.   1. Postmenopausal/atrophic genital changes. No significant hot flushes, night sweats, vaginal dryness or any vaginal bleeding. Continue to monitor and report any issues or bleeding. 2. History of right breast cancer. Exam NED. Mammography 12/2015. Continue with annual mammography when due. Declines genetic counseling/testing 3. Colonoscopy 2016. Repeat at their recommended interval. 4. Osteoporosis. Followed by Dr.  Reynaldo Minium. 5. Pap smear 2017. Pap smear done today. Will continue with annual screening due to history of cancers to include breast and tongue 6. Health maintenance. No routine lab work done as this is done elsewhere. Follow up in one year, sooner as needed.  [name] MD, 4:26 PM 09/06/2016

## 2016-09-06 NOTE — Addendum Note (Signed)
Addended by: Nelva Nay on: 09/06/2016 04:35 PM   Modules accepted: Orders

## 2016-09-07 LAB — PAP IG W/ RFLX HPV ASCU

## 2016-09-26 ENCOUNTER — Ambulatory Visit (HOSPITAL_COMMUNITY)
Admission: RE | Admit: 2016-09-26 | Discharge: 2016-09-26 | Disposition: A | Discharge status: Home / Self Care | Payer: Medicare Other | Source: Ambulatory Visit | Attending: Internal Medicine | Admitting: Internal Medicine

## 2016-09-26 ENCOUNTER — Encounter (HOSPITAL_COMMUNITY): Payer: Self-pay

## 2016-09-26 DIAGNOSIS — M810 Age-related osteoporosis without current pathological fracture: Secondary | ICD-10-CM | POA: Insufficient documentation

## 2016-09-26 MED ORDER — ZOLEDRONIC ACID 5 MG/100ML IV SOLN
5.0000 mg | Freq: Once | INTRAVENOUS | Status: AC
  Administered 2016-09-26: 5 mg via INTRAVENOUS
  Filled 2016-09-26: qty 100

## 2016-09-26 MED ORDER — SODIUM CHLORIDE 0.9 % IV SOLN
INTRAVENOUS | Status: AC
  Administered 2016-09-26: 13:00:00 via INTRAVENOUS

## 2016-09-26 NOTE — Discharge Instructions (Signed)
Zoledronic Acid injection (Paget's Disease, Osteoporosis) / Reclast infusion  °What is this medicine? °ZOLEDRONIC ACID (ZOE le dron ik AS id) lowers the amount of calcium loss from bone. It is used to treat Paget's disease and osteoporosis in women. °This medicine may be used for other purposes; ask your health care provider or pharmacist if you have questions. °COMMON BRAND NAME(S): Reclast, Zometa °What should I tell my health care provider before I take this medicine? °They need to know if you have any of these conditions: °-aspirin-sensitive asthma °-cancer, especially if you are receiving medicines used to treat cancer °-dental disease or wear dentures °-infection °-kidney disease °-low levels of calcium in the blood °-past surgery on the parathyroid gland or intestines °-receiving corticosteroids like dexamethasone or prednisone °-an unusual or allergic reaction to zoledronic acid, other medicines, foods, dyes, or preservatives °-pregnant or trying to get pregnant °-breast-feeding °How should I use this medicine? °This medicine is for infusion into a vein. It is given by a health care professional in a hospital or clinic setting. °Talk to your pediatrician regarding the use of this medicine in children. This medicine is not approved for use in children. °Overdosage: If you think you have taken too much of this medicine contact a poison control center or emergency room at once. °NOTE: This medicine is only for you. Do not share this medicine with others. °What if I miss a dose? °It is important not to miss your dose. Call your doctor or health care professional if you are unable to keep an appointment. °What may interact with this medicine? °-certain antibiotics given by injection °-NSAIDs, medicines for pain and inflammation, like ibuprofen or naproxen °-some diuretics like bumetanide, furosemide °-teriparatide °This list may not describe all possible interactions. Give your health care provider a list of all  the medicines, herbs, non-prescription drugs, or dietary supplements you use. Also tell them if you smoke, drink alcohol, or use illegal drugs. Some items may interact with your medicine. °What should I watch for while using this medicine? °Visit your doctor or health care professional for regular checkups. It may be some time before you see the benefit from this medicine. Do not stop taking your medicine unless your doctor tells you to. Your doctor may order blood tests or other tests to see how you are doing. °Women should inform their doctor if they wish to become pregnant or think they might be pregnant. There is a potential for serious side effects to an unborn child. Talk to your health care professional or pharmacist for more information. °You should make sure that you get enough calcium and vitamin D while you are taking this medicine. Discuss the foods you eat and the vitamins you take with your health care professional. °Some people who take this medicine have severe bone, joint, and/or muscle pain. This medicine may also increase your risk for jaw problems or a broken thigh bone. Tell your doctor right away if you have severe pain in your jaw, bones, joints, or muscles. Tell your doctor if you have any pain that does not go away or that gets worse. °Tell your dentist and dental surgeon that you are taking this medicine. You should not have major dental surgery while on this medicine. See your dentist to have a dental exam and fix any dental problems before starting this medicine. Take good care of your teeth while on this medicine. Make sure you see your dentist for regular follow-up appointments. °What side effects may I notice   from receiving this medicine? °Side effects that you should report to your doctor or health care professional as soon as possible: °-allergic reactions like skin rash, itching or hives, swelling of the face, lips, or tongue °-anxiety, confusion, or depression °-breathing  problems °-changes in vision °-eye pain °-feeling faint or lightheaded, falls °-jaw pain, especially after dental work °-mouth sores °-muscle cramps, stiffness, or weakness °-redness, blistering, peeling or loosening of the skin, including inside the mouth °-trouble passing urine or change in the amount of urine °Side effects that usually do not require medical attention (report to your doctor or health care professional if they continue or are bothersome): °-bone, joint, or muscle pain °-constipation °-diarrhea °-fever °-hair loss °-irritation at site where injected °-loss of appetite °-nausea, vomiting °-stomach upset °-trouble sleeping °-trouble swallowing °-weak or tired °This list may not describe all possible side effects. Call your doctor for medical advice about side effects. You may report side effects to FDA at 1-800-FDA-1088. °Where should I keep my medicine? °This drug is given in a hospital or clinic and will not be stored at home. °NOTE: This sheet is a summary. It may not cover all possible information. If you have questions about this medicine, talk to your doctor, pharmacist, or health care provider. °© 2018 [employer] (2013-09-07 14:19:57) ° °

## 2016-09-26 NOTE — Progress Notes (Signed)
Post infusion experiencing some hypertension but asymptomatic. Pt states she has experienced this in past and is able to take her BP at home, will notify MD of symptoms or continued elevated BP.

## 2016-12-14 ENCOUNTER — Other Ambulatory Visit: Payer: Self-pay | Admitting: Gynecology

## 2016-12-14 DIAGNOSIS — Z1231 Encounter for screening mammogram for malignant neoplasm of breast: Secondary | ICD-10-CM

## 2017-01-16 ENCOUNTER — Ambulatory Visit
Admission: RE | Admit: 2017-01-16 | Discharge: 2017-01-16 | Disposition: A | Discharge status: Home / Self Care | Payer: Medicare Other | Source: Ambulatory Visit | Attending: Gynecology | Admitting: Gynecology

## 2017-01-16 DIAGNOSIS — Z1231 Encounter for screening mammogram for malignant neoplasm of breast: Secondary | ICD-10-CM

## 2017-09-20 ENCOUNTER — Encounter: Payer: Self-pay | Admitting: Gynecology

## 2017-09-20 ENCOUNTER — Ambulatory Visit: Payer: Medicare Other | Admitting: Gynecology

## 2017-09-20 VITALS — BP 118/78 | Ht 63.0 in | Wt 144.0 lb

## 2017-09-20 DIAGNOSIS — M810 Age-related osteoporosis without current pathological fracture: Secondary | ICD-10-CM | POA: Diagnosis not present

## 2017-09-20 DIAGNOSIS — N952 Postmenopausal atrophic vaginitis: Secondary | ICD-10-CM | POA: Diagnosis not present

## 2017-09-20 DIAGNOSIS — Z853 Personal history of malignant neoplasm of breast: Secondary | ICD-10-CM | POA: Diagnosis not present

## 2017-09-20 DIAGNOSIS — Z01419 Encounter for gynecological examination (general) (routine) without abnormal findings: Secondary | ICD-10-CM | POA: Diagnosis not present

## 2017-09-20 NOTE — Patient Instructions (Signed)
Follow-up in 1 year for annual exam, sooner as needed. 

## 2017-09-20 NOTE — Progress Notes (Signed)
    FEDERICA ALLPORT 1951-03-17 [id number]        67 y.o.  [id number] for annual gynecologic exam.  Doing well without gynecologic complaints.  Past medical history,surgical history, problem list, medications, allergies, family history and social history were all reviewed and documented as reviewed in the EPIC chart.  ROS:  Performed with pertinent positives and negatives included in the history, assessment and plan.   Additional significant findings : None   Exam: Caryn Bee assistant Vitals:   09/20/17 1524  BP: 118/78  Weight: 144 lb (65.3 kg)  Height: 5\' 3"  (1.6 m)   Body mass index is 25.51 kg/m.  General appearance:  Normal affect, orientation and appearance. Skin: Grossly normal HEENT: Without gross lesions.  No cervical or supraclavicular adenopathy. Thyroid normal.  Lungs:  Clear without wheezing, rales or rhonchi Cardiac: RR, without RMG Abdominal:  Soft, nontender, without masses, guarding, rebound, organomegaly or hernia Breasts:  Examined lying and sitting.  Left without masses, retractions, discharge or axillary adenopathy.  Right status post mastectomy with reconstruction and implant.  No masses or axillary adenopathy Pelvic:  Ext, BUS, Vagina: With atrophic changes  Cervix: With atrophic changes  Uterus: Anteverted, normal size, shape and contour, midline and mobile nontender   Adnexa: Without masses or tenderness    Anus and perineum: Normal   Rectovaginal: Normal sphincter tone without palpated masses or tenderness.    Assessment/Plan:  67 y.o. [id number] female for annual gynecologic exam.   1. Postmenopausal/atrophic genital changes.  No significant hot flushes, night sweats, vaginal dryness or any bleeding. 2. History of right breast cancer.  Exam NED.  Mammography 12/2016.  Continue with annual mammography.  Had discussed genetic counseling in the past and she has declined. 3. Pap smear 2018.  No Pap smear done today.  No history of abnormal Pap smear.  History  of breast and tongue cancer.  Has been screened for HPV also in the past which has been negative.  Will plan continued screening but less frequent intervals. 4. Colonoscopy 2016.  Repeat colonoscopy at their recommendation. 5. Osteoporosis.  Being followed by Dr. Reynaldo Minium.  On Reclast for 5 years historically.  Last bone density reported 2015.  Of asked her to call his office to see when they want to repeat this.  Continue to follow-up with Dr. Reynaldo Minium for bone health management. 6. Health maintenance.  No routine lab work done as patient does this elsewhere.  Follow-up 1 year, sooner as needed.   [name] MD, 4:01 PM 09/20/2017

## 2017-09-25 ENCOUNTER — Encounter (HOSPITAL_COMMUNITY): Payer: Medicare Other

## 2017-09-26 ENCOUNTER — Encounter (HOSPITAL_COMMUNITY): Payer: Medicare Other

## 2017-09-26 ENCOUNTER — Other Ambulatory Visit (HOSPITAL_COMMUNITY): Payer: Self-pay | Admitting: *Deleted

## 2017-09-27 ENCOUNTER — Ambulatory Visit (HOSPITAL_COMMUNITY)
Admission: RE | Admit: 2017-09-27 | Discharge: 2017-09-27 | Disposition: A | Discharge status: Home / Self Care | Payer: Medicare Other | Source: Ambulatory Visit | Attending: Internal Medicine | Admitting: Internal Medicine

## 2017-09-27 DIAGNOSIS — M810 Age-related osteoporosis without current pathological fracture: Secondary | ICD-10-CM | POA: Diagnosis present

## 2017-09-27 MED ORDER — ZOLEDRONIC ACID 5 MG/100ML IV SOLN
INTRAVENOUS | Status: AC
  Administered 2017-09-27: 5 mg via INTRAVENOUS
  Filled 2017-09-27: qty 100

## 2017-09-27 MED ORDER — ZOLEDRONIC ACID 5 MG/100ML IV SOLN
5.0000 mg | Freq: Once | INTRAVENOUS | Status: AC
  Administered 2017-09-27: 5 mg via INTRAVENOUS

## 2017-10-17 ENCOUNTER — Encounter: Payer: Self-pay | Admitting: Neurology

## 2017-10-17 ENCOUNTER — Encounter: Payer: Self-pay | Admitting: *Deleted

## 2017-10-17 ENCOUNTER — Ambulatory Visit: Payer: Medicare Other | Admitting: Neurology

## 2017-10-17 VITALS — BP 149/77 | HR 75 | Ht 64.0 in | Wt 146.0 lb

## 2017-10-17 DIAGNOSIS — R202 Paresthesia of skin: Secondary | ICD-10-CM | POA: Insufficient documentation

## 2017-10-17 DIAGNOSIS — Z85810 Personal history of malignant neoplasm of tongue: Secondary | ICD-10-CM

## 2017-10-17 DIAGNOSIS — G540 Brachial plexus disorders: Secondary | ICD-10-CM

## 2017-10-17 DIAGNOSIS — Z853 Personal history of malignant neoplasm of breast: Secondary | ICD-10-CM | POA: Diagnosis not present

## 2017-10-17 DIAGNOSIS — R221 Localized swelling, mass and lump, neck: Secondary | ICD-10-CM
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
## 2017-10-18 ENCOUNTER — Telehealth: Payer: Self-pay | Admitting: Neurology

## 2017-10-18 NOTE — Telephone Encounter (Signed)
[employer] Medicare order sent to GI. They will reach out to the pt to schedule.  °

## 2017-10-23 ENCOUNTER — Telehealth: Payer: Self-pay | Admitting: Neurology

## 2017-10-23 DIAGNOSIS — G540 Brachial plexus disorders: Secondary | ICD-10-CM

## 2017-10-23 DIAGNOSIS — R221 Localized swelling, mass and lump, neck: Secondary | ICD-10-CM

## 2017-10-23 DIAGNOSIS — Z853 Personal history of malignant neoplasm of breast: Secondary | ICD-10-CM

## 2017-10-23 DIAGNOSIS — R202 Paresthesia of skin: Secondary | ICD-10-CM

## 2017-10-23 DIAGNOSIS — Z85810 Personal history of malignant neoplasm of tongue: Secondary | ICD-10-CM

## 2017-10-23 NOTE — Telephone Encounter (Signed)
Dr. Krista Blue or Sharyn Lull , Please advise per [facility]  PET scan whole body are done  for melanoma or multiple myeloma .  Dr. Krista Blue is this what you are look for ?   If not PET Scan has to be done for PET scan skull to Thigh or PET Scan Metabolic Aaron Edelman . Please advise me Thanks Hinton Dyer.

## 2017-10-23 NOTE — Telephone Encounter (Signed)
PET scan skull to thigh

## 2017-10-24 ENCOUNTER — Other Ambulatory Visit: Payer: Self-pay | Admitting: *Deleted

## 2017-10-24 ENCOUNTER — Ambulatory Visit
Admission: RE | Admit: 2017-10-24 | Discharge: 2017-10-24 | Disposition: A | Discharge status: Home / Self Care | Payer: Medicare Other | Source: Ambulatory Visit | Attending: Neurology | Admitting: Neurology

## 2017-10-24 DIAGNOSIS — Z853 Personal history of malignant neoplasm of breast: Secondary | ICD-10-CM

## 2017-10-24 DIAGNOSIS — Z85810 Personal history of malignant neoplasm of tongue: Secondary | ICD-10-CM

## 2017-10-24 DIAGNOSIS — R202 Paresthesia of skin: Secondary | ICD-10-CM

## 2017-10-24 DIAGNOSIS — G540 Brachial plexus disorders: Secondary | ICD-10-CM

## 2017-10-24 DIAGNOSIS — R221 Localized swelling, mass and lump, neck: Secondary | ICD-10-CM

## 2017-10-24 MED ORDER — GADOBENATE DIMEGLUMINE 529 MG/ML IV SOLN
13.0000 mL | Freq: Once | INTRAVENOUS | Status: AC | PRN
  Administered 2017-10-24: 13 mL via INTRAVENOUS

## 2017-10-24 NOTE — Telephone Encounter (Signed)
Ok thanks Please place a new order so I can get authorization . Thanks Hinton Dyer

## 2017-10-24 NOTE — Telephone Encounter (Signed)
Called and left patient a message asking her to call me back .  Patient is scheduled . Patient needs to arrive Diane Macdonald at 6:30 am Patient needs to be NPO after Midnight. I have left her a message asking her to call me back.

## 2017-10-24 NOTE — Addendum Note (Signed)
Addended by: Noberto Retort C on: 10/24/2017 01:19 PM   Modules accepted: Orders

## 2017-10-24 NOTE — Addendum Note (Signed)
Addended by: Noberto Retort C on: 10/24/2017 10:36 AM   Modules accepted: Orders

## 2017-10-25 ENCOUNTER — Telehealth: Payer: Self-pay | Admitting: Neurology

## 2017-10-25 NOTE — Telephone Encounter (Signed)
LMOM with below MRI results; pt to call back if she has questions/fim

## 2017-10-25 NOTE — Telephone Encounter (Signed)
MRI of neck showed no evidence of acute abnormality, no evidence of recurrent tumor  IMPRESSION: Unchanged appearance of the neck without evidence of recurrent malignancy or acute abnormality.

## 2017-10-31 NOTE — Telephone Encounter (Signed)
Patient is aware of her apt and she will be there.

## 2017-11-01 ENCOUNTER — Telehealth: Payer: Self-pay | Admitting: Neurology

## 2017-11-01 ENCOUNTER — Encounter (HOSPITAL_COMMUNITY)
Admission: RE | Admit: 2017-11-01 | Discharge: 2017-11-01 | Disposition: A | Discharge status: Home / Self Care | Payer: Medicare Other | Source: Ambulatory Visit | Attending: Neurology | Admitting: Neurology

## 2017-11-01 DIAGNOSIS — Z85810 Personal history of malignant neoplasm of tongue: Secondary | ICD-10-CM | POA: Diagnosis present

## 2017-11-01 DIAGNOSIS — R202 Paresthesia of skin: Secondary | ICD-10-CM | POA: Insufficient documentation

## 2017-11-01 DIAGNOSIS — G540 Brachial plexus disorders: Secondary | ICD-10-CM | POA: Insufficient documentation

## 2017-11-01 DIAGNOSIS — Z853 Personal history of malignant neoplasm of breast: Secondary | ICD-10-CM

## 2017-11-01 DIAGNOSIS — R221 Localized swelling, mass and lump, neck: Secondary | ICD-10-CM | POA: Diagnosis present

## 2017-11-01 LAB — GLUCOSE, CAPILLARY: Glucose-Capillary: 98 mg/dL (ref 70–99)

## 2017-11-01 MED ORDER — FLUDEOXYGLUCOSE F - 18 (FDG) INJECTION
7.2900 | Freq: Once | INTRAVENOUS | Status: AC | PRN
  Administered 2017-11-01: 7.29 via INTRAVENOUS

## 2017-11-01 NOTE — Telephone Encounter (Signed)
Left messages on both home and mobile numbers.

## 2017-11-01 NOTE — Telephone Encounter (Signed)
Pt retuning RNs call

## 2017-11-01 NOTE — Telephone Encounter (Signed)
Spoke to patient and reviewed her results below.  States she has never taken any medication in the past for her symptoms.  She would like to wait to discuss treatment until after her NCV/EMG.  Her test appt has been moved up to 11/03/17.

## 2017-11-01 NOTE — Telephone Encounter (Signed)
Please call patient, PET scan showed no evidence of metastatic lymph node to the right supraclavicular region. There was a suggestion of diffuse linear activity within the esophagus, suggestive of esophagitis,   ask her to see if she had any symptomatic treatment for her right arm paresthesia, such as Neurontin, Lyrica  May take Tums or Zantac for heartburn  Keep EMG nerve conduction study on November 24, 2017   IMPRESSION: 1. No evidence metastatic adenopathy to the RIGHT supraclavicular region. No hypermetabolic cervical lymph nodes. 2. No evidence of breast cancer or head neck cancer local recurrence or metastasis. 3. Postradiation change in the RIGHT upper lobe. 4. Diffuse linear activity within esophagus consistent with esophagitis.

## 2017-11-03 ENCOUNTER — Encounter: Payer: Self-pay | Admitting: Neurology

## 2017-11-03 ENCOUNTER — Ambulatory Visit (INDEPENDENT_AMBULATORY_CARE_PROVIDER_SITE_OTHER): Payer: Medicare Other | Admitting: Neurology

## 2017-11-03 DIAGNOSIS — Z0289 Encounter for other administrative examinations: Secondary | ICD-10-CM

## 2017-11-03 DIAGNOSIS — Z85810 Personal history of malignant neoplasm of tongue: Secondary | ICD-10-CM

## 2017-11-03 DIAGNOSIS — G540 Brachial plexus disorders: Secondary | ICD-10-CM

## 2017-11-03 DIAGNOSIS — Z853 Personal history of malignant neoplasm of breast: Secondary | ICD-10-CM

## 2017-11-03 DIAGNOSIS — R202 Paresthesia of skin: Secondary | ICD-10-CM

## 2017-11-03 DIAGNOSIS — R221 Localized swelling, mass and lump, neck: Secondary | ICD-10-CM

## 2017-11-03 NOTE — Procedures (Signed)
        Full Name: Diane Macdonald Gender: Female MRN #: [id number] Date of Birth: [birth year]    Visit Date: 11/03/2017 09:52 Age: 67 Years 3 Months Old Examining Physician: Marcial Pacas, MD  Referring Physician: Krista Blue, MD History:  67 years old female presented with intermittent right lateral 3 fingers paresthesia  Summary of the tests:  Nerve conduction study: Right median and ulnar sensory responses showed normal peak latency, with moderate to severely decreased to snap amplitude.  Right radial sensory response was normal.  Right median and ulnar motor responses were normal.  Electromyography: Selective needle examination of right upper extremity muscles were normal.    Conclusion:  This is a slight abnormal study.  There is electrodiagnostic evidence of mild right hand neuropathy, involving right median and ulnar nerves, no evidence of right brachial plexopathy or right cervical radiculopathy.   ------------------------------- Marcial Pacas, M.D.  [facility] [place], [place] [address] Tel: [phone] Fax: [phone]        [facility]    Nerve / Sites Muscle Latency Ref. Amplitude Ref. Rel Amp Segments Distance Velocity Ref. Area    ms ms mV mV %  cm m/s m/s mVms  R Median - APB     Wrist APB 2.8 ?4.4 6.5 ?4.0 100 Wrist - APB 7   27.6     Upper arm APB 6.7  6.3  96.3 Upper arm - Wrist 20 51 ?49 27.9  R Ulnar - ADM     Wrist ADM 2.7 ?3.3 7.0 ?6.0 100 Wrist - ADM 7   21.3     B.Elbow ADM 6.2  6.4  91.8 B.Elbow - Wrist 18 52 ?49 20.3     A.Elbow ADM 8.8  5.5  86.4 A.Elbow - B.Elbow 10 39 ?49 18.5         A.Elbow - Wrist             SNC    Nerve / Sites Rec. Site Peak Lat Ref.  Amp Ref. Segments Distance    ms ms V V  cm  R Radial - Anatomical snuff box (Forearm)     Forearm Wrist 2.7 ?2.9 10 ?15 Forearm - Wrist 10  R Median - Orthodromic (Dig II, Mid palm)     Dig II Wrist 3.4 ?3.4 5 ?10 Dig II - Wrist 13  R Ulnar - Orthodromic, (Dig  V, Mid palm)     Dig V Wrist 2.7 ?3.1 3 ?5 Dig V - Wrist 17             F  Wave    Nerve F Lat Ref.   ms ms  R Ulnar - ADM 32.0 ?32.0       EMG full       EMG Summary Table    Spontaneous MUAP Recruitment  Muscle IA Fib PSW Fasc Other Amp Dur. Poly Pattern  R. First dorsal interosseous Normal None None None _______ Normal Normal Normal Normal  R. Pronator teres Normal None None None _______ Normal Normal Normal Normal  R. Biceps brachii Normal None None None _______ Normal Normal Normal Normal  R. Deltoid Normal None None None _______ Normal Normal Normal Normal  R. Triceps brachii Normal None None None _______ Normal Normal Normal Normal  R. Extensor digitorum communis Normal None None None _______ Normal Normal Normal Normal  R. Brachioradialis Normal None None None _______ Normal Normal Normal Normal

## 2017-11-21 ENCOUNTER — Other Ambulatory Visit: Payer: Self-pay | Admitting: Internal Medicine

## 2017-11-21 DIAGNOSIS — Z1231 Encounter for screening mammogram for malignant neoplasm of breast: Secondary | ICD-10-CM

## 2017-11-24 ENCOUNTER — Encounter: Payer: Medicare Other | Admitting: Neurology

## 2018-01-17 ENCOUNTER — Ambulatory Visit
Admission: RE | Admit: 2018-01-17 | Discharge: 2018-01-17 | Disposition: A | Discharge status: Home / Self Care | Payer: Medicare Other | Source: Ambulatory Visit | Attending: Internal Medicine | Admitting: Internal Medicine

## 2018-01-17 DIAGNOSIS — Z1231 Encounter for screening mammogram for malignant neoplasm of breast: Secondary | ICD-10-CM

## 2018-09-26 ENCOUNTER — Encounter: Payer: Medicare Other | Admitting: Gynecology

## 2018-10-01 ENCOUNTER — Ambulatory Visit: Payer: Medicare Other | Admitting: Gynecology

## 2018-10-01 ENCOUNTER — Other Ambulatory Visit: Payer: Self-pay

## 2018-10-01 ENCOUNTER — Encounter: Payer: Self-pay | Admitting: Gynecology

## 2018-10-01 VITALS — BP 118/76 | Ht 63.0 in | Wt 145.0 lb

## 2018-10-01 DIAGNOSIS — N952 Postmenopausal atrophic vaginitis: Secondary | ICD-10-CM

## 2018-10-01 DIAGNOSIS — Z01419 Encounter for gynecological examination (general) (routine) without abnormal findings: Secondary | ICD-10-CM

## 2018-10-01 DIAGNOSIS — M810 Age-related osteoporosis without current pathological fracture: Secondary | ICD-10-CM

## 2018-10-01 DIAGNOSIS — Z853 Personal history of malignant neoplasm of breast: Secondary | ICD-10-CM | POA: Diagnosis not present

## 2018-10-01 NOTE — Patient Instructions (Signed)
Follow-up in 1 year for annual exam, sooner as needed. 

## 2018-10-01 NOTE — Progress Notes (Signed)
    Diane Macdonald Apr 23, 1951 [id number]        68 y.o.  [id number] for annual gynecologic exam.  Without gynecologic complaints  Past medical history,surgical history, problem list, medications, allergies, family history and social history were all reviewed and documented as reviewed in the EPIC chart.  ROS:  Performed with pertinent positives and negatives included in the history, assessment and plan.   Additional significant findings : None   Exam: Caryn Bee assistant Vitals:   10/01/18 1445  BP: 118/76  Weight: 145 lb (65.8 kg)  Height: 5\' 3"  (1.6 m)   Body mass index is 25.69 kg/m.  General appearance:  Normal affect, orientation and appearance. Skin: Grossly normal HEENT: Without gross lesions.  No cervical or supraclavicular adenopathy. Thyroid normal.  Lungs:  Clear without wheezing, rales or rhonchi Cardiac: RR, without RMG Abdominal:  Soft, nontender, without masses, guarding, rebound, organomegaly or hernia Breasts:  Examined lying and sitting.  Left without masses, retractions, discharge or axillary adenopathy.  Right status post mastectomy and reconstruction with implant.  No masses or adenopathy. Pelvic:  Ext, BUS, Vagina: With atrophic changes  Cervix: With atrophic changes  Uterus: Anteverted, normal size, shape and contour, midline and mobile nontender   Adnexa: Without masses or tenderness    Anus and perineum: Normal   Rectovaginal: Normal sphincter tone without palpated masses or tenderness.    Assessment/Plan:  68 y.o. [id number] female for annual gynecologic exam.   1. Postmenopausal.  No significant menopausal symptoms or any vaginal bleeding. 2. History of right breast cancer.  Mammography 12/2017.  Continue with annual mammography when due.  Exam NED.  Has declined genetic testing. 3. Osteoporosis.  DEXA 2019.  Continues on Reclast for approximately 5 to 6 years through Dr. Harley Hallmark office.  We will continue to follow-up with him in reference to bone  health. 4. Colonoscopy 2016.  Repeat at their recommended interval. 5. Pap smear 08/2016.  No Pap smear done today.  No history of abnormal Pap smears.  Plan follow-up Pap smear next year at 3-year interval. 6. Health maintenance.  No routine lab work done as patient does this elsewhere.   [name] MD, 3:20 PM 10/01/2018

## 2018-10-04 ENCOUNTER — Other Ambulatory Visit (HOSPITAL_COMMUNITY): Payer: Self-pay | Admitting: *Deleted

## 2018-10-05 ENCOUNTER — Ambulatory Visit (HOSPITAL_COMMUNITY)
Admission: RE | Admit: 2018-10-05 | Discharge: 2018-10-05 | Disposition: A | Discharge status: Home / Self Care | Payer: Medicare Other | Source: Ambulatory Visit | Attending: Internal Medicine | Admitting: Internal Medicine

## 2018-10-05 ENCOUNTER — Other Ambulatory Visit: Payer: Self-pay

## 2018-10-05 DIAGNOSIS — M810 Age-related osteoporosis without current pathological fracture: Secondary | ICD-10-CM | POA: Insufficient documentation

## 2018-10-05 MED ORDER — ZOLEDRONIC ACID 5 MG/100ML IV SOLN
INTRAVENOUS | Status: AC
  Filled 2018-10-05: qty 100

## 2018-10-05 MED ORDER — ZOLEDRONIC ACID 5 MG/100ML IV SOLN
5.0000 mg | Freq: Once | INTRAVENOUS | Status: AC
  Administered 2018-10-05: 5 mg via INTRAVENOUS

## 2018-12-07 ENCOUNTER — Other Ambulatory Visit: Payer: Self-pay | Admitting: Internal Medicine

## 2018-12-07 DIAGNOSIS — Z1231 Encounter for screening mammogram for malignant neoplasm of breast: Secondary | ICD-10-CM

## 2019-01-23 ENCOUNTER — Ambulatory Visit
Admission: RE | Admit: 2019-01-23 | Discharge: 2019-01-23 | Disposition: A | Discharge status: Home / Self Care | Payer: Medicare Other | Source: Ambulatory Visit | Attending: Internal Medicine | Admitting: Internal Medicine

## 2019-01-23 ENCOUNTER — Other Ambulatory Visit: Payer: Self-pay

## 2019-01-23 DIAGNOSIS — Z1231 Encounter for screening mammogram for malignant neoplasm of breast: Secondary | ICD-10-CM

## 2019-01-30 ENCOUNTER — Encounter: Payer: Self-pay | Admitting: Gynecology

## 2019-08-27 DIAGNOSIS — J301 Allergic rhinitis due to pollen: Secondary | ICD-10-CM | POA: Diagnosis not present

## 2019-08-27 DIAGNOSIS — J3089 Other allergic rhinitis: Secondary | ICD-10-CM | POA: Diagnosis not present

## 2019-09-03 DIAGNOSIS — J301 Allergic rhinitis due to pollen: Secondary | ICD-10-CM | POA: Diagnosis not present

## 2019-09-03 DIAGNOSIS — J3089 Other allergic rhinitis: Secondary | ICD-10-CM | POA: Diagnosis not present

## 2019-09-10 DIAGNOSIS — J3089 Other allergic rhinitis: Secondary | ICD-10-CM | POA: Diagnosis not present

## 2019-09-10 DIAGNOSIS — J301 Allergic rhinitis due to pollen: Secondary | ICD-10-CM | POA: Diagnosis not present

## 2019-09-24 DIAGNOSIS — J3089 Other allergic rhinitis: Secondary | ICD-10-CM | POA: Diagnosis not present

## 2019-09-24 DIAGNOSIS — J301 Allergic rhinitis due to pollen: Secondary | ICD-10-CM | POA: Diagnosis not present

## 2019-10-01 DIAGNOSIS — J3089 Other allergic rhinitis: Secondary | ICD-10-CM | POA: Diagnosis not present

## 2019-10-01 DIAGNOSIS — J301 Allergic rhinitis due to pollen: Secondary | ICD-10-CM | POA: Diagnosis not present

## 2019-10-02 ENCOUNTER — Encounter: Payer: Medicare Other | Admitting: Obstetrics and Gynecology

## 2019-10-02 DIAGNOSIS — J3089 Other allergic rhinitis: Secondary | ICD-10-CM | POA: Diagnosis not present

## 2019-10-02 DIAGNOSIS — J301 Allergic rhinitis due to pollen: Secondary | ICD-10-CM | POA: Diagnosis not present

## 2019-10-07 ENCOUNTER — Other Ambulatory Visit: Payer: Self-pay

## 2019-10-08 ENCOUNTER — Encounter: Payer: Self-pay | Admitting: Obstetrics and Gynecology

## 2019-10-08 ENCOUNTER — Ambulatory Visit: Payer: Medicare PPO | Admitting: Obstetrics and Gynecology

## 2019-10-08 VITALS — BP 124/76 | Ht 63.0 in | Wt 146.0 lb

## 2019-10-08 DIAGNOSIS — Z124 Encounter for screening for malignant neoplasm of cervix: Secondary | ICD-10-CM

## 2019-10-08 DIAGNOSIS — Z01419 Encounter for gynecological examination (general) (routine) without abnormal findings: Secondary | ICD-10-CM | POA: Diagnosis not present

## 2019-10-08 DIAGNOSIS — J301 Allergic rhinitis due to pollen: Secondary | ICD-10-CM | POA: Diagnosis not present

## 2019-10-08 DIAGNOSIS — Z9289 Personal history of other medical treatment: Secondary | ICD-10-CM | POA: Diagnosis not present

## 2019-10-08 DIAGNOSIS — J3089 Other allergic rhinitis: Secondary | ICD-10-CM | POA: Diagnosis not present

## 2019-10-08 DIAGNOSIS — M810 Age-related osteoporosis without current pathological fracture: Secondary | ICD-10-CM

## 2019-10-08 DIAGNOSIS — Z853 Personal history of malignant neoplasm of breast: Secondary | ICD-10-CM

## 2019-10-08 NOTE — Progress Notes (Signed)
Diane Macdonald 1950/10/09 [id number]  SUBJECTIVE:  69 y.o. [id number] female for annual routine gynecologic exam and Pap smear. She has no gynecologic concerns.  Current Outpatient Medications  Medication Sig Dispense Refill  . Atorvastatin Calcium (LIPITOR PO) Take by mouth.    Marland Kitchen azelastine (OPTIVAR) 0.05 % ophthalmic solution   0  . citalopram (CELEXA) 40 MG tablet Take 40 mg by mouth daily.    . famotidine (PEPCID) 40 MG tablet Take 40 mg by mouth daily.    . fexofenadine (ALLEGRA) 180 MG tablet Take 180 mg by mouth daily. Reported on 04/24/2015    . hydrochlorothiazide (HYDRODIURIL) 25 MG tablet Take 25 mg by mouth daily.    . Ibuprofen-Diphenhydramine Cit (ADVIL PM PO) Take by mouth as needed.    . Multiple Vitamin (MULTIVITAMIN) capsule Take 1 capsule by mouth daily.    Marland Kitchen olmesartan (BENICAR) 40 MG tablet Take 40 mg by mouth daily.    Marland Kitchen PRESCRIPTION MEDICATION ALLERGY SHOTS    . zoledronic acid (RECLAST) 5 MG/100ML SOLN injection Inject 5 mg into the vein once.     No current facility-administered medications for this visit.   Allergies: Mycinettes  No LMP recorded. Patient is postmenopausal.  Past medical history,surgical history, problem list, medications, allergies, family history and social history were all reviewed and documented as reviewed in the EPIC chart.  ROS:  Feeling well. No dyspnea or chest pain on exertion.  No abdominal pain, change in bowel habits, black or bloody stools.  No urinary tract symptoms. GYN ROS: no abnormal bleeding, pelvic pain or discharge, no breast pain or new or enlarging lumps on self exam. No neurological complaints.   OBJECTIVE:  Ht 5\' 3"  (1.6 m)   Wt 146 lb (66.2 kg)   BMI 25.86 kg/m  The patient appears well, alert, oriented x 3, in no distress. ENT normal.  Neck supple. No cervical or supraclavicular adenopathy or thyromegaly.  Lungs are clear, good air entry, no wheezes, rhonchi or rales. S1 and S2 normal, no murmurs, regular rate  and rhythm.  Abdomen soft without tenderness, guarding, mass or organomegaly.  Neurological is normal, no focal findings.  BREAST EXAM: Right breast status postmastectomy and reconstruction with implant present, left breast appear normal, neither side with suspicious masses, no skin or nipple changes or axillary nodes  PELVIC EXAM: VULVA: normal appearing vulva with no masses, tenderness or lesions, atrophic changes, VAGINA: normal appearing vagina with normal color and discharge, no lesions, CERVIX: normal appearing cervix without discharge or lesions, UTERUS: uterus is normal size, shape, consistency and nontender, ADNEXA: normal adnexa in size, nontender and no masses, PAP: Pap smear done today, thin-prep method  Chaperone: Caryn Bee present during the examination  ASSESSMENT:  69 y.o. [id number] here for annual gynecologic exam  PLAN:   1. Postmenopausal.  No significant hot flashes or other menopausal symptoms.  No vaginal bleeding. 2. Pap smear 08/2016.  No significant history of abnormal Pap smears.  With prior history of cancer of breast and tongue, she feels more comfortable with continuing to screen.  Pap smear is collected today. 3. History of right breast cancer.  Mammogram 12/2018.  Normal breast exam today/NED.  To annual with annual mammograms. 4. Colonoscopy 2016.  Recommended that she follow up at the recommended interval. 5. Osteoporosis.  DEXA 2019.  She has been on Reclast for approximately 6 years and follows with Dr. Reynaldo Minium for bone health related matters. 6. Health maintenance.  No labs today as she normally  has these completed elsewhere.  Return annually or sooner, prn.  Joseph Pierini MD 10/08/19

## 2019-10-08 NOTE — Addendum Note (Signed)
Addended by: Nelva Nay on: 10/08/2019 11:19 AM   Modules accepted: Orders

## 2019-10-09 DIAGNOSIS — M810 Age-related osteoporosis without current pathological fracture: Secondary | ICD-10-CM | POA: Diagnosis not present

## 2019-10-09 DIAGNOSIS — Z79899 Other long term (current) drug therapy: Secondary | ICD-10-CM | POA: Diagnosis not present

## 2019-10-09 LAB — PAP IG W/ RFLX HPV ASCU

## 2019-10-15 DIAGNOSIS — J301 Allergic rhinitis due to pollen: Secondary | ICD-10-CM | POA: Diagnosis not present

## 2019-10-15 DIAGNOSIS — J3089 Other allergic rhinitis: Secondary | ICD-10-CM | POA: Diagnosis not present

## 2019-10-22 DIAGNOSIS — J3089 Other allergic rhinitis: Secondary | ICD-10-CM | POA: Diagnosis not present

## 2019-10-22 DIAGNOSIS — J301 Allergic rhinitis due to pollen: Secondary | ICD-10-CM | POA: Diagnosis not present

## 2019-10-30 ENCOUNTER — Other Ambulatory Visit (HOSPITAL_COMMUNITY): Payer: Self-pay

## 2019-10-30 DIAGNOSIS — J3089 Other allergic rhinitis: Secondary | ICD-10-CM | POA: Diagnosis not present

## 2019-10-30 DIAGNOSIS — J301 Allergic rhinitis due to pollen: Secondary | ICD-10-CM | POA: Diagnosis not present

## 2019-10-31 ENCOUNTER — Ambulatory Visit (HOSPITAL_COMMUNITY)
Admission: RE | Admit: 2019-10-31 | Discharge: 2019-10-31 | Disposition: A | Discharge status: Home / Self Care | Payer: Medicare PPO | Source: Ambulatory Visit | Attending: Internal Medicine | Admitting: Internal Medicine

## 2019-10-31 ENCOUNTER — Other Ambulatory Visit: Payer: Self-pay

## 2019-10-31 DIAGNOSIS — M810 Age-related osteoporosis without current pathological fracture: Secondary | ICD-10-CM | POA: Insufficient documentation

## 2019-10-31 MED ORDER — ZOLEDRONIC ACID 5 MG/100ML IV SOLN: 5.0000 mg | Freq: Once | INTRAVENOUS | Status: AC

## 2019-10-31 MED ORDER — ZOLEDRONIC ACID 5 MG/100ML IV SOLN
INTRAVENOUS | Status: AC
  Administered 2019-10-31: 5 mg via INTRAVENOUS
  Filled 2019-10-31: qty 100

## 2019-11-04 DIAGNOSIS — J3089 Other allergic rhinitis: Secondary | ICD-10-CM | POA: Diagnosis not present

## 2019-11-04 DIAGNOSIS — J301 Allergic rhinitis due to pollen: Secondary | ICD-10-CM | POA: Diagnosis not present

## 2019-11-12 DIAGNOSIS — J3089 Other allergic rhinitis: Secondary | ICD-10-CM | POA: Diagnosis not present

## 2019-11-12 DIAGNOSIS — J301 Allergic rhinitis due to pollen: Secondary | ICD-10-CM | POA: Diagnosis not present

## 2019-11-19 DIAGNOSIS — J301 Allergic rhinitis due to pollen: Secondary | ICD-10-CM | POA: Diagnosis not present

## 2019-11-19 DIAGNOSIS — J3089 Other allergic rhinitis: Secondary | ICD-10-CM | POA: Diagnosis not present

## 2019-11-25 ENCOUNTER — Other Ambulatory Visit: Payer: Self-pay | Admitting: Internal Medicine

## 2019-11-25 DIAGNOSIS — Z1231 Encounter for screening mammogram for malignant neoplasm of breast: Secondary | ICD-10-CM

## 2019-11-26 DIAGNOSIS — J3089 Other allergic rhinitis: Secondary | ICD-10-CM | POA: Diagnosis not present

## 2019-11-26 DIAGNOSIS — J301 Allergic rhinitis due to pollen: Secondary | ICD-10-CM | POA: Diagnosis not present

## 2019-12-04 DIAGNOSIS — J3089 Other allergic rhinitis: Secondary | ICD-10-CM | POA: Diagnosis not present

## 2019-12-04 DIAGNOSIS — J301 Allergic rhinitis due to pollen: Secondary | ICD-10-CM | POA: Diagnosis not present

## 2019-12-11 DIAGNOSIS — J301 Allergic rhinitis due to pollen: Secondary | ICD-10-CM | POA: Diagnosis not present

## 2019-12-11 DIAGNOSIS — J3089 Other allergic rhinitis: Secondary | ICD-10-CM | POA: Diagnosis not present

## 2019-12-18 DIAGNOSIS — J301 Allergic rhinitis due to pollen: Secondary | ICD-10-CM | POA: Diagnosis not present

## 2019-12-18 DIAGNOSIS — J3089 Other allergic rhinitis: Secondary | ICD-10-CM | POA: Diagnosis not present

## 2019-12-24 DIAGNOSIS — J301 Allergic rhinitis due to pollen: Secondary | ICD-10-CM | POA: Diagnosis not present

## 2019-12-24 DIAGNOSIS — J3089 Other allergic rhinitis: Secondary | ICD-10-CM | POA: Diagnosis not present

## 2019-12-31 DIAGNOSIS — J301 Allergic rhinitis due to pollen: Secondary | ICD-10-CM | POA: Diagnosis not present

## 2019-12-31 DIAGNOSIS — J3089 Other allergic rhinitis: Secondary | ICD-10-CM | POA: Diagnosis not present

## 2020-01-07 DIAGNOSIS — J3089 Other allergic rhinitis: Secondary | ICD-10-CM | POA: Diagnosis not present

## 2020-01-07 DIAGNOSIS — J301 Allergic rhinitis due to pollen: Secondary | ICD-10-CM | POA: Diagnosis not present

## 2020-01-09 DIAGNOSIS — L720 Epidermal cyst: Secondary | ICD-10-CM | POA: Diagnosis not present

## 2020-01-09 DIAGNOSIS — L821 Other seborrheic keratosis: Secondary | ICD-10-CM | POA: Diagnosis not present

## 2020-01-09 DIAGNOSIS — L814 Other melanin hyperpigmentation: Secondary | ICD-10-CM | POA: Diagnosis not present

## 2020-01-09 DIAGNOSIS — D224 Melanocytic nevi of scalp and neck: Secondary | ICD-10-CM | POA: Diagnosis not present

## 2020-01-09 DIAGNOSIS — L308 Other specified dermatitis: Secondary | ICD-10-CM | POA: Diagnosis not present

## 2020-01-09 DIAGNOSIS — D1801 Hemangioma of skin and subcutaneous tissue: Secondary | ICD-10-CM | POA: Diagnosis not present

## 2020-01-13 DIAGNOSIS — J3089 Other allergic rhinitis: Secondary | ICD-10-CM | POA: Diagnosis not present

## 2020-01-13 DIAGNOSIS — J301 Allergic rhinitis due to pollen: Secondary | ICD-10-CM | POA: Diagnosis not present

## 2020-01-13 DIAGNOSIS — L309 Dermatitis, unspecified: Secondary | ICD-10-CM | POA: Diagnosis not present

## 2020-01-13 DIAGNOSIS — H1045 Other chronic allergic conjunctivitis: Secondary | ICD-10-CM | POA: Diagnosis not present

## 2020-01-16 DIAGNOSIS — I10 Essential (primary) hypertension: Secondary | ICD-10-CM | POA: Diagnosis not present

## 2020-01-16 DIAGNOSIS — M810 Age-related osteoporosis without current pathological fracture: Secondary | ICD-10-CM | POA: Diagnosis not present

## 2020-01-16 DIAGNOSIS — E785 Hyperlipidemia, unspecified: Secondary | ICD-10-CM | POA: Diagnosis not present

## 2020-01-21 DIAGNOSIS — J3089 Other allergic rhinitis: Secondary | ICD-10-CM | POA: Diagnosis not present

## 2020-01-21 DIAGNOSIS — J301 Allergic rhinitis due to pollen: Secondary | ICD-10-CM | POA: Diagnosis not present

## 2020-01-23 DIAGNOSIS — E785 Hyperlipidemia, unspecified: Secondary | ICD-10-CM | POA: Diagnosis not present

## 2020-01-23 DIAGNOSIS — M810 Age-related osteoporosis without current pathological fracture: Secondary | ICD-10-CM | POA: Diagnosis not present

## 2020-01-23 DIAGNOSIS — E663 Overweight: Secondary | ICD-10-CM | POA: Diagnosis not present

## 2020-01-23 DIAGNOSIS — Z0000 Encounter for general adult medical examination without abnormal findings: Secondary | ICD-10-CM | POA: Diagnosis not present

## 2020-01-23 DIAGNOSIS — R82998 Other abnormal findings in urine: Secondary | ICD-10-CM | POA: Diagnosis not present

## 2020-01-23 DIAGNOSIS — I10 Essential (primary) hypertension: Secondary | ICD-10-CM | POA: Diagnosis not present

## 2020-01-23 DIAGNOSIS — Z853 Personal history of malignant neoplasm of breast: Secondary | ICD-10-CM | POA: Diagnosis not present

## 2020-01-23 DIAGNOSIS — D649 Anemia, unspecified: Secondary | ICD-10-CM | POA: Diagnosis not present

## 2020-01-27 ENCOUNTER — Ambulatory Visit
Admission: RE | Admit: 2020-01-27 | Discharge: 2020-01-27 | Disposition: A | Discharge status: Home / Self Care | Payer: Medicare PPO | Source: Ambulatory Visit | Attending: Internal Medicine | Admitting: Internal Medicine

## 2020-01-27 ENCOUNTER — Other Ambulatory Visit: Payer: Self-pay

## 2020-01-27 DIAGNOSIS — Z1231 Encounter for screening mammogram for malignant neoplasm of breast: Secondary | ICD-10-CM

## 2020-01-28 DIAGNOSIS — J301 Allergic rhinitis due to pollen: Secondary | ICD-10-CM | POA: Diagnosis not present

## 2020-01-28 DIAGNOSIS — J3089 Other allergic rhinitis: Secondary | ICD-10-CM | POA: Diagnosis not present

## 2020-02-05 DIAGNOSIS — J3089 Other allergic rhinitis: Secondary | ICD-10-CM | POA: Diagnosis not present

## 2020-02-05 DIAGNOSIS — J301 Allergic rhinitis due to pollen: Secondary | ICD-10-CM | POA: Diagnosis not present

## 2020-02-11 DIAGNOSIS — J3089 Other allergic rhinitis: Secondary | ICD-10-CM | POA: Diagnosis not present

## 2020-02-11 DIAGNOSIS — J301 Allergic rhinitis due to pollen: Secondary | ICD-10-CM | POA: Diagnosis not present

## 2020-02-19 DIAGNOSIS — J301 Allergic rhinitis due to pollen: Secondary | ICD-10-CM | POA: Diagnosis not present

## 2020-02-19 DIAGNOSIS — J3089 Other allergic rhinitis: Secondary | ICD-10-CM | POA: Diagnosis not present

## 2020-02-27 DIAGNOSIS — J3081 Allergic rhinitis due to animal (cat) (dog) hair and dander: Secondary | ICD-10-CM | POA: Diagnosis not present

## 2020-02-27 DIAGNOSIS — J3089 Other allergic rhinitis: Secondary | ICD-10-CM | POA: Diagnosis not present

## 2020-02-27 DIAGNOSIS — J301 Allergic rhinitis due to pollen: Secondary | ICD-10-CM | POA: Diagnosis not present

## 2020-03-04 DIAGNOSIS — J3089 Other allergic rhinitis: Secondary | ICD-10-CM | POA: Diagnosis not present

## 2020-03-04 DIAGNOSIS — J301 Allergic rhinitis due to pollen: Secondary | ICD-10-CM | POA: Diagnosis not present

## 2020-03-12 DIAGNOSIS — J3089 Other allergic rhinitis: Secondary | ICD-10-CM | POA: Diagnosis not present

## 2020-03-12 DIAGNOSIS — J301 Allergic rhinitis due to pollen: Secondary | ICD-10-CM | POA: Diagnosis not present

## 2020-03-13 DIAGNOSIS — Z1212 Encounter for screening for malignant neoplasm of rectum: Secondary | ICD-10-CM | POA: Diagnosis not present

## 2020-03-18 DIAGNOSIS — J301 Allergic rhinitis due to pollen: Secondary | ICD-10-CM | POA: Diagnosis not present

## 2020-03-18 DIAGNOSIS — J3089 Other allergic rhinitis: Secondary | ICD-10-CM | POA: Diagnosis not present

## 2020-03-24 DIAGNOSIS — J3089 Other allergic rhinitis: Secondary | ICD-10-CM | POA: Diagnosis not present

## 2020-03-24 DIAGNOSIS — J301 Allergic rhinitis due to pollen: Secondary | ICD-10-CM | POA: Diagnosis not present

## 2020-03-31 DIAGNOSIS — J301 Allergic rhinitis due to pollen: Secondary | ICD-10-CM | POA: Diagnosis not present

## 2020-03-31 DIAGNOSIS — J3089 Other allergic rhinitis: Secondary | ICD-10-CM | POA: Diagnosis not present

## 2020-04-01 DIAGNOSIS — J301 Allergic rhinitis due to pollen: Secondary | ICD-10-CM | POA: Diagnosis not present

## 2020-04-01 DIAGNOSIS — J3089 Other allergic rhinitis: Secondary | ICD-10-CM | POA: Diagnosis not present

## 2020-04-06 DIAGNOSIS — J3089 Other allergic rhinitis: Secondary | ICD-10-CM | POA: Diagnosis not present

## 2020-04-06 DIAGNOSIS — J301 Allergic rhinitis due to pollen: Secondary | ICD-10-CM | POA: Diagnosis not present

## 2020-04-14 DIAGNOSIS — J301 Allergic rhinitis due to pollen: Secondary | ICD-10-CM | POA: Diagnosis not present

## 2020-04-14 DIAGNOSIS — J3089 Other allergic rhinitis: Secondary | ICD-10-CM | POA: Diagnosis not present

## 2020-04-21 DIAGNOSIS — J3089 Other allergic rhinitis: Secondary | ICD-10-CM | POA: Diagnosis not present

## 2020-04-21 DIAGNOSIS — J301 Allergic rhinitis due to pollen: Secondary | ICD-10-CM | POA: Diagnosis not present

## 2020-04-28 DIAGNOSIS — J3089 Other allergic rhinitis: Secondary | ICD-10-CM | POA: Diagnosis not present

## 2020-04-28 DIAGNOSIS — J301 Allergic rhinitis due to pollen: Secondary | ICD-10-CM | POA: Diagnosis not present

## 2020-05-06 DIAGNOSIS — J3089 Other allergic rhinitis: Secondary | ICD-10-CM | POA: Diagnosis not present

## 2020-05-06 DIAGNOSIS — J301 Allergic rhinitis due to pollen: Secondary | ICD-10-CM | POA: Diagnosis not present

## 2020-05-13 DIAGNOSIS — J301 Allergic rhinitis due to pollen: Secondary | ICD-10-CM | POA: Diagnosis not present

## 2020-05-13 DIAGNOSIS — J3089 Other allergic rhinitis: Secondary | ICD-10-CM | POA: Diagnosis not present

## 2020-05-19 DIAGNOSIS — J301 Allergic rhinitis due to pollen: Secondary | ICD-10-CM | POA: Diagnosis not present

## 2020-05-19 DIAGNOSIS — J3089 Other allergic rhinitis: Secondary | ICD-10-CM | POA: Diagnosis not present

## 2020-05-26 DIAGNOSIS — J3089 Other allergic rhinitis: Secondary | ICD-10-CM | POA: Diagnosis not present

## 2020-05-26 DIAGNOSIS — J301 Allergic rhinitis due to pollen: Secondary | ICD-10-CM | POA: Diagnosis not present

## 2020-06-02 DIAGNOSIS — J3089 Other allergic rhinitis: Secondary | ICD-10-CM | POA: Diagnosis not present

## 2020-06-02 DIAGNOSIS — J301 Allergic rhinitis due to pollen: Secondary | ICD-10-CM | POA: Diagnosis not present

## 2020-06-10 DIAGNOSIS — J3089 Other allergic rhinitis: Secondary | ICD-10-CM | POA: Diagnosis not present

## 2020-06-10 DIAGNOSIS — J301 Allergic rhinitis due to pollen: Secondary | ICD-10-CM | POA: Diagnosis not present

## 2020-06-16 DIAGNOSIS — J3089 Other allergic rhinitis: Secondary | ICD-10-CM | POA: Diagnosis not present

## 2020-06-16 DIAGNOSIS — J301 Allergic rhinitis due to pollen: Secondary | ICD-10-CM | POA: Diagnosis not present

## 2020-06-23 DIAGNOSIS — J3089 Other allergic rhinitis: Secondary | ICD-10-CM | POA: Diagnosis not present

## 2020-06-23 DIAGNOSIS — J301 Allergic rhinitis due to pollen: Secondary | ICD-10-CM | POA: Diagnosis not present

## 2020-07-06 DIAGNOSIS — J3089 Other allergic rhinitis: Secondary | ICD-10-CM | POA: Diagnosis not present

## 2020-07-06 DIAGNOSIS — J301 Allergic rhinitis due to pollen: Secondary | ICD-10-CM | POA: Diagnosis not present

## 2020-07-14 DIAGNOSIS — J3089 Other allergic rhinitis: Secondary | ICD-10-CM | POA: Diagnosis not present

## 2020-07-14 DIAGNOSIS — J301 Allergic rhinitis due to pollen: Secondary | ICD-10-CM | POA: Diagnosis not present

## 2020-07-21 DIAGNOSIS — J3089 Other allergic rhinitis: Secondary | ICD-10-CM | POA: Diagnosis not present

## 2020-07-21 DIAGNOSIS — J301 Allergic rhinitis due to pollen: Secondary | ICD-10-CM | POA: Diagnosis not present

## 2020-07-21 DIAGNOSIS — H5203 Hypermetropia, bilateral: Secondary | ICD-10-CM | POA: Diagnosis not present

## 2020-07-21 DIAGNOSIS — H52223 Regular astigmatism, bilateral: Secondary | ICD-10-CM | POA: Diagnosis not present

## 2020-07-21 DIAGNOSIS — H2513 Age-related nuclear cataract, bilateral: Secondary | ICD-10-CM | POA: Diagnosis not present

## 2020-07-21 DIAGNOSIS — H524 Presbyopia: Secondary | ICD-10-CM | POA: Diagnosis not present

## 2020-07-21 DIAGNOSIS — H02055 Trichiasis without entropian left lower eyelid: Secondary | ICD-10-CM | POA: Diagnosis not present

## 2020-07-22 DIAGNOSIS — Z853 Personal history of malignant neoplasm of breast: Secondary | ICD-10-CM | POA: Diagnosis not present

## 2020-07-22 DIAGNOSIS — I10 Essential (primary) hypertension: Secondary | ICD-10-CM | POA: Diagnosis not present

## 2020-07-22 DIAGNOSIS — D649 Anemia, unspecified: Secondary | ICD-10-CM | POA: Diagnosis not present

## 2020-07-29 DIAGNOSIS — J301 Allergic rhinitis due to pollen: Secondary | ICD-10-CM | POA: Diagnosis not present

## 2020-07-29 DIAGNOSIS — J3089 Other allergic rhinitis: Secondary | ICD-10-CM | POA: Diagnosis not present

## 2020-08-05 DIAGNOSIS — J301 Allergic rhinitis due to pollen: Secondary | ICD-10-CM | POA: Diagnosis not present

## 2020-08-05 DIAGNOSIS — J3089 Other allergic rhinitis: Secondary | ICD-10-CM | POA: Diagnosis not present

## 2020-08-11 DIAGNOSIS — J301 Allergic rhinitis due to pollen: Secondary | ICD-10-CM | POA: Diagnosis not present

## 2020-08-11 DIAGNOSIS — J3089 Other allergic rhinitis: Secondary | ICD-10-CM | POA: Diagnosis not present

## 2020-08-18 DIAGNOSIS — J3089 Other allergic rhinitis: Secondary | ICD-10-CM | POA: Diagnosis not present

## 2020-08-18 DIAGNOSIS — J301 Allergic rhinitis due to pollen: Secondary | ICD-10-CM | POA: Diagnosis not present

## 2020-08-24 DIAGNOSIS — H9201 Otalgia, right ear: Secondary | ICD-10-CM | POA: Diagnosis not present

## 2020-08-24 DIAGNOSIS — J0100 Acute maxillary sinusitis, unspecified: Secondary | ICD-10-CM | POA: Diagnosis not present

## 2020-08-24 DIAGNOSIS — J029 Acute pharyngitis, unspecified: Secondary | ICD-10-CM | POA: Diagnosis not present

## 2020-08-24 DIAGNOSIS — R220 Localized swelling, mass and lump, head: Secondary | ICD-10-CM | POA: Diagnosis not present

## 2020-08-24 DIAGNOSIS — I10 Essential (primary) hypertension: Secondary | ICD-10-CM | POA: Diagnosis not present

## 2020-09-02 DIAGNOSIS — J3089 Other allergic rhinitis: Secondary | ICD-10-CM | POA: Diagnosis not present

## 2020-09-02 DIAGNOSIS — J301 Allergic rhinitis due to pollen: Secondary | ICD-10-CM | POA: Diagnosis not present

## 2020-09-09 DIAGNOSIS — J3089 Other allergic rhinitis: Secondary | ICD-10-CM | POA: Diagnosis not present

## 2020-09-09 DIAGNOSIS — J301 Allergic rhinitis due to pollen: Secondary | ICD-10-CM | POA: Diagnosis not present

## 2020-09-15 DIAGNOSIS — J3089 Other allergic rhinitis: Secondary | ICD-10-CM | POA: Diagnosis not present

## 2020-09-15 DIAGNOSIS — J301 Allergic rhinitis due to pollen: Secondary | ICD-10-CM | POA: Diagnosis not present

## 2020-09-16 DIAGNOSIS — J301 Allergic rhinitis due to pollen: Secondary | ICD-10-CM | POA: Diagnosis not present

## 2020-09-16 DIAGNOSIS — J3089 Other allergic rhinitis: Secondary | ICD-10-CM | POA: Diagnosis not present

## 2020-09-22 DIAGNOSIS — J301 Allergic rhinitis due to pollen: Secondary | ICD-10-CM | POA: Diagnosis not present

## 2020-09-22 DIAGNOSIS — J3089 Other allergic rhinitis: Secondary | ICD-10-CM | POA: Diagnosis not present

## 2020-09-29 DIAGNOSIS — J3089 Other allergic rhinitis: Secondary | ICD-10-CM | POA: Diagnosis not present

## 2020-09-29 DIAGNOSIS — J301 Allergic rhinitis due to pollen: Secondary | ICD-10-CM | POA: Diagnosis not present

## 2020-10-13 DIAGNOSIS — J301 Allergic rhinitis due to pollen: Secondary | ICD-10-CM | POA: Diagnosis not present

## 2020-10-13 DIAGNOSIS — J3089 Other allergic rhinitis: Secondary | ICD-10-CM | POA: Diagnosis not present
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
## 2020-10-20 DIAGNOSIS — J301 Allergic rhinitis due to pollen: Secondary | ICD-10-CM | POA: Diagnosis not present

## 2020-10-20 DIAGNOSIS — J3089 Other allergic rhinitis: Secondary | ICD-10-CM | POA: Diagnosis not present

## 2020-10-23 ENCOUNTER — Ambulatory Visit: Payer: Medicare PPO | Admitting: Obstetrics and Gynecology

## 2020-10-23 ENCOUNTER — Encounter: Payer: Self-pay | Admitting: Obstetrics and Gynecology

## 2020-10-23 ENCOUNTER — Other Ambulatory Visit: Payer: Self-pay

## 2020-10-23 VITALS — BP 122/76 | HR 72 | Ht 62.0 in | Wt 142.0 lb

## 2020-10-23 DIAGNOSIS — Z853 Personal history of malignant neoplasm of breast: Secondary | ICD-10-CM | POA: Diagnosis not present

## 2020-10-23 DIAGNOSIS — Z01419 Encounter for gynecological examination (general) (routine) without abnormal findings: Secondary | ICD-10-CM

## 2020-10-23 DIAGNOSIS — Z1239 Encounter for other screening for malignant neoplasm of breast: Secondary | ICD-10-CM

## 2020-10-23 DIAGNOSIS — Z008 Encounter for other general examination: Secondary | ICD-10-CM

## 2020-10-23 NOTE — Patient Instructions (Signed)

## 2020-10-27 DIAGNOSIS — Z79899 Other long term (current) drug therapy: Secondary | ICD-10-CM | POA: Diagnosis not present

## 2020-10-27 DIAGNOSIS — M810 Age-related osteoporosis without current pathological fracture: Secondary | ICD-10-CM | POA: Diagnosis not present

## 2020-10-28 DIAGNOSIS — J3089 Other allergic rhinitis: Secondary | ICD-10-CM | POA: Diagnosis not present

## 2020-10-28 DIAGNOSIS — J301 Allergic rhinitis due to pollen: Secondary | ICD-10-CM | POA: Diagnosis not present

## 2020-10-30 ENCOUNTER — Other Ambulatory Visit (HOSPITAL_COMMUNITY): Payer: Self-pay | Admitting: *Deleted

## 2020-11-02 ENCOUNTER — Other Ambulatory Visit: Payer: Self-pay

## 2020-11-02 ENCOUNTER — Ambulatory Visit (HOSPITAL_COMMUNITY)
Admission: RE | Admit: 2020-11-02 | Discharge: 2020-11-02 | Disposition: A | Discharge status: Home / Self Care | Payer: Medicare PPO | Source: Ambulatory Visit | Attending: Internal Medicine | Admitting: Internal Medicine

## 2020-11-02 DIAGNOSIS — M810 Age-related osteoporosis without current pathological fracture: Secondary | ICD-10-CM | POA: Insufficient documentation

## 2020-11-02 MED ORDER — ZOLEDRONIC ACID 5 MG/100ML IV SOLN
INTRAVENOUS | Status: AC
  Administered 2020-11-02: 5 mg via INTRAVENOUS
  Filled 2020-11-02: qty 100

## 2020-11-02 MED ORDER — ZOLEDRONIC ACID 5 MG/100ML IV SOLN: 5.0000 mg | Freq: Once | INTRAVENOUS | Status: AC

## 2020-11-03 DIAGNOSIS — J301 Allergic rhinitis due to pollen: Secondary | ICD-10-CM | POA: Diagnosis not present

## 2020-11-03 DIAGNOSIS — J3089 Other allergic rhinitis: Secondary | ICD-10-CM | POA: Diagnosis not present

## 2020-11-10 DIAGNOSIS — J301 Allergic rhinitis due to pollen: Secondary | ICD-10-CM | POA: Diagnosis not present

## 2020-11-10 DIAGNOSIS — J3089 Other allergic rhinitis: Secondary | ICD-10-CM | POA: Diagnosis not present

## 2020-11-18 DIAGNOSIS — J301 Allergic rhinitis due to pollen: Secondary | ICD-10-CM | POA: Diagnosis not present

## 2020-11-18 DIAGNOSIS — J3089 Other allergic rhinitis: Secondary | ICD-10-CM | POA: Diagnosis not present

## 2020-11-24 DIAGNOSIS — J3089 Other allergic rhinitis: Secondary | ICD-10-CM | POA: Diagnosis not present

## 2020-11-24 DIAGNOSIS — J301 Allergic rhinitis due to pollen: Secondary | ICD-10-CM | POA: Diagnosis not present

## 2020-12-02 DIAGNOSIS — J301 Allergic rhinitis due to pollen: Secondary | ICD-10-CM | POA: Diagnosis not present

## 2020-12-02 DIAGNOSIS — J3089 Other allergic rhinitis: Secondary | ICD-10-CM | POA: Diagnosis not present

## 2020-12-02 DIAGNOSIS — J3081 Allergic rhinitis due to animal (cat) (dog) hair and dander: Secondary | ICD-10-CM | POA: Diagnosis not present

## 2020-12-08 DIAGNOSIS — J301 Allergic rhinitis due to pollen: Secondary | ICD-10-CM | POA: Diagnosis not present

## 2020-12-08 DIAGNOSIS — J3089 Other allergic rhinitis: Secondary | ICD-10-CM | POA: Diagnosis not present

## 2020-12-16 DIAGNOSIS — J301 Allergic rhinitis due to pollen: Secondary | ICD-10-CM | POA: Diagnosis not present

## 2020-12-16 DIAGNOSIS — J3089 Other allergic rhinitis: Secondary | ICD-10-CM | POA: Diagnosis not present

## 2020-12-18 ENCOUNTER — Other Ambulatory Visit: Payer: Self-pay | Admitting: Internal Medicine

## 2020-12-18 DIAGNOSIS — Z1231 Encounter for screening mammogram for malignant neoplasm of breast: Secondary | ICD-10-CM

## 2020-12-22 DIAGNOSIS — J3089 Other allergic rhinitis: Secondary | ICD-10-CM | POA: Diagnosis not present

## 2020-12-22 DIAGNOSIS — J301 Allergic rhinitis due to pollen: Secondary | ICD-10-CM | POA: Diagnosis not present

## 2020-12-29 DIAGNOSIS — J3089 Other allergic rhinitis: Secondary | ICD-10-CM | POA: Diagnosis not present

## 2020-12-29 DIAGNOSIS — J301 Allergic rhinitis due to pollen: Secondary | ICD-10-CM | POA: Diagnosis not present

## 2021-01-05 DIAGNOSIS — J3089 Other allergic rhinitis: Secondary | ICD-10-CM | POA: Diagnosis not present

## 2021-01-05 DIAGNOSIS — J301 Allergic rhinitis due to pollen: Secondary | ICD-10-CM | POA: Diagnosis not present

## 2021-01-11 DIAGNOSIS — L309 Dermatitis, unspecified: Secondary | ICD-10-CM | POA: Diagnosis not present

## 2021-01-11 DIAGNOSIS — H1045 Other chronic allergic conjunctivitis: Secondary | ICD-10-CM | POA: Diagnosis not present

## 2021-01-11 DIAGNOSIS — J3089 Other allergic rhinitis: Secondary | ICD-10-CM | POA: Diagnosis not present

## 2021-01-11 DIAGNOSIS — J301 Allergic rhinitis due to pollen: Secondary | ICD-10-CM | POA: Diagnosis not present

## 2021-01-12 DIAGNOSIS — J3089 Other allergic rhinitis: Secondary | ICD-10-CM | POA: Diagnosis not present

## 2021-01-12 DIAGNOSIS — J301 Allergic rhinitis due to pollen: Secondary | ICD-10-CM | POA: Diagnosis not present

## 2021-01-19 DIAGNOSIS — J3089 Other allergic rhinitis: Secondary | ICD-10-CM | POA: Diagnosis not present

## 2021-01-19 DIAGNOSIS — J301 Allergic rhinitis due to pollen: Secondary | ICD-10-CM | POA: Diagnosis not present

## 2021-01-20 DIAGNOSIS — E785 Hyperlipidemia, unspecified: Secondary | ICD-10-CM | POA: Diagnosis not present

## 2021-01-20 DIAGNOSIS — I10 Essential (primary) hypertension: Secondary | ICD-10-CM | POA: Diagnosis not present

## 2021-01-20 DIAGNOSIS — M810 Age-related osteoporosis without current pathological fracture: Secondary | ICD-10-CM | POA: Diagnosis not present

## 2021-01-27 DIAGNOSIS — R82998 Other abnormal findings in urine: Secondary | ICD-10-CM | POA: Diagnosis not present

## 2021-01-27 DIAGNOSIS — Z853 Personal history of malignant neoplasm of breast: Secondary | ICD-10-CM | POA: Diagnosis not present

## 2021-01-27 DIAGNOSIS — Z0000 Encounter for general adult medical examination without abnormal findings: Secondary | ICD-10-CM | POA: Diagnosis not present

## 2021-01-27 DIAGNOSIS — E785 Hyperlipidemia, unspecified: Secondary | ICD-10-CM | POA: Diagnosis not present

## 2021-01-27 DIAGNOSIS — I10 Essential (primary) hypertension: Secondary | ICD-10-CM | POA: Diagnosis not present

## 2021-01-27 DIAGNOSIS — Z1339 Encounter for screening examination for other mental health and behavioral disorders: Secondary | ICD-10-CM | POA: Diagnosis not present

## 2021-01-27 DIAGNOSIS — Z1331 Encounter for screening for depression: Secondary | ICD-10-CM | POA: Diagnosis not present

## 2021-01-27 DIAGNOSIS — M810 Age-related osteoporosis without current pathological fracture: Secondary | ICD-10-CM | POA: Diagnosis not present

## 2021-01-28 ENCOUNTER — Other Ambulatory Visit: Payer: Self-pay

## 2021-01-28 ENCOUNTER — Ambulatory Visit
Admission: RE | Admit: 2021-01-28 | Discharge: 2021-01-28 | Disposition: A | Discharge status: Home / Self Care | Payer: Medicare PPO | Source: Ambulatory Visit | Attending: Internal Medicine | Admitting: Internal Medicine

## 2021-01-28 DIAGNOSIS — Z1231 Encounter for screening mammogram for malignant neoplasm of breast: Secondary | ICD-10-CM

## 2021-02-02 DIAGNOSIS — J3089 Other allergic rhinitis: Secondary | ICD-10-CM | POA: Diagnosis not present

## 2021-02-02 DIAGNOSIS — J301 Allergic rhinitis due to pollen: Secondary | ICD-10-CM | POA: Diagnosis not present

## 2021-02-10 DIAGNOSIS — J3089 Other allergic rhinitis: Secondary | ICD-10-CM | POA: Diagnosis not present

## 2021-02-10 DIAGNOSIS — J301 Allergic rhinitis due to pollen: Secondary | ICD-10-CM | POA: Diagnosis not present

## 2021-02-16 DIAGNOSIS — M810 Age-related osteoporosis without current pathological fracture: Secondary | ICD-10-CM | POA: Diagnosis not present

## 2021-02-17 DIAGNOSIS — J301 Allergic rhinitis due to pollen: Secondary | ICD-10-CM | POA: Diagnosis not present

## 2021-02-17 DIAGNOSIS — Z1212 Encounter for screening for malignant neoplasm of rectum: Secondary | ICD-10-CM | POA: Diagnosis not present

## 2021-02-17 DIAGNOSIS — J3089 Other allergic rhinitis: Secondary | ICD-10-CM | POA: Diagnosis not present

## 2021-02-24 DIAGNOSIS — J3089 Other allergic rhinitis: Secondary | ICD-10-CM | POA: Diagnosis not present

## 2021-02-24 DIAGNOSIS — J301 Allergic rhinitis due to pollen: Secondary | ICD-10-CM | POA: Diagnosis not present

## 2021-03-08 DIAGNOSIS — J301 Allergic rhinitis due to pollen: Secondary | ICD-10-CM | POA: Diagnosis not present

## 2021-03-08 DIAGNOSIS — J3089 Other allergic rhinitis: Secondary | ICD-10-CM | POA: Diagnosis not present

## 2021-03-10 DIAGNOSIS — J3089 Other allergic rhinitis: Secondary | ICD-10-CM | POA: Diagnosis not present

## 2021-03-10 DIAGNOSIS — J301 Allergic rhinitis due to pollen: Secondary | ICD-10-CM | POA: Diagnosis not present

## 2021-03-15 DIAGNOSIS — J3089 Other allergic rhinitis: Secondary | ICD-10-CM | POA: Diagnosis not present

## 2021-03-15 DIAGNOSIS — J301 Allergic rhinitis due to pollen: Secondary | ICD-10-CM | POA: Diagnosis not present

## 2021-03-25 DIAGNOSIS — J3089 Other allergic rhinitis: Secondary | ICD-10-CM | POA: Diagnosis not present

## 2021-03-25 DIAGNOSIS — J301 Allergic rhinitis due to pollen: Secondary | ICD-10-CM | POA: Diagnosis not present

## 2021-04-01 DIAGNOSIS — J3089 Other allergic rhinitis: Secondary | ICD-10-CM | POA: Diagnosis not present

## 2021-04-01 DIAGNOSIS — J301 Allergic rhinitis due to pollen: Secondary | ICD-10-CM | POA: Diagnosis not present

## 2021-04-06 DIAGNOSIS — J301 Allergic rhinitis due to pollen: Secondary | ICD-10-CM | POA: Diagnosis not present

## 2021-04-06 DIAGNOSIS — J3089 Other allergic rhinitis: Secondary | ICD-10-CM | POA: Diagnosis not present

## 2021-04-13 DIAGNOSIS — J3089 Other allergic rhinitis: Secondary | ICD-10-CM | POA: Diagnosis not present

## 2021-04-13 DIAGNOSIS — J301 Allergic rhinitis due to pollen: Secondary | ICD-10-CM | POA: Diagnosis not present

## 2021-04-27 DIAGNOSIS — J3089 Other allergic rhinitis: Secondary | ICD-10-CM | POA: Diagnosis not present

## 2021-04-27 DIAGNOSIS — J301 Allergic rhinitis due to pollen: Secondary | ICD-10-CM | POA: Diagnosis not present

## 2021-05-04 DIAGNOSIS — J301 Allergic rhinitis due to pollen: Secondary | ICD-10-CM | POA: Diagnosis not present

## 2021-05-04 DIAGNOSIS — J3089 Other allergic rhinitis: Secondary | ICD-10-CM | POA: Diagnosis not present

## 2021-05-05 DIAGNOSIS — D225 Melanocytic nevi of trunk: Secondary | ICD-10-CM | POA: Diagnosis not present

## 2021-05-05 DIAGNOSIS — L821 Other seborrheic keratosis: Secondary | ICD-10-CM | POA: Diagnosis not present

## 2021-05-05 DIAGNOSIS — L814 Other melanin hyperpigmentation: Secondary | ICD-10-CM | POA: Diagnosis not present

## 2021-05-11 DIAGNOSIS — J301 Allergic rhinitis due to pollen: Secondary | ICD-10-CM | POA: Diagnosis not present

## 2021-05-11 DIAGNOSIS — J3089 Other allergic rhinitis: Secondary | ICD-10-CM | POA: Diagnosis not present

## 2021-05-20 DIAGNOSIS — J301 Allergic rhinitis due to pollen: Secondary | ICD-10-CM | POA: Diagnosis not present

## 2021-05-20 DIAGNOSIS — J3089 Other allergic rhinitis: Secondary | ICD-10-CM | POA: Diagnosis not present

## 2021-05-27 DIAGNOSIS — J3089 Other allergic rhinitis: Secondary | ICD-10-CM | POA: Diagnosis not present

## 2021-05-27 DIAGNOSIS — J301 Allergic rhinitis due to pollen: Secondary | ICD-10-CM | POA: Diagnosis not present

## 2021-06-02 DIAGNOSIS — J301 Allergic rhinitis due to pollen: Secondary | ICD-10-CM | POA: Diagnosis not present

## 2021-06-02 DIAGNOSIS — J3089 Other allergic rhinitis: Secondary | ICD-10-CM | POA: Diagnosis not present

## 2021-06-09 DIAGNOSIS — J3089 Other allergic rhinitis: Secondary | ICD-10-CM | POA: Diagnosis not present

## 2021-06-09 DIAGNOSIS — J301 Allergic rhinitis due to pollen: Secondary | ICD-10-CM | POA: Diagnosis not present

## 2021-06-22 DIAGNOSIS — J3089 Other allergic rhinitis: Secondary | ICD-10-CM | POA: Diagnosis not present

## 2021-06-22 DIAGNOSIS — J301 Allergic rhinitis due to pollen: Secondary | ICD-10-CM | POA: Diagnosis not present

## 2021-06-22 DIAGNOSIS — J3081 Allergic rhinitis due to animal (cat) (dog) hair and dander: Secondary | ICD-10-CM | POA: Diagnosis not present

## 2021-06-29 DIAGNOSIS — J3089 Other allergic rhinitis: Secondary | ICD-10-CM | POA: Diagnosis not present

## 2021-06-29 DIAGNOSIS — J301 Allergic rhinitis due to pollen: Secondary | ICD-10-CM | POA: Diagnosis not present

## 2021-07-08 DIAGNOSIS — J3089 Other allergic rhinitis: Secondary | ICD-10-CM | POA: Diagnosis not present

## 2021-07-08 DIAGNOSIS — J301 Allergic rhinitis due to pollen: Secondary | ICD-10-CM | POA: Diagnosis not present

## 2021-07-13 DIAGNOSIS — J3081 Allergic rhinitis due to animal (cat) (dog) hair and dander: Secondary | ICD-10-CM | POA: Diagnosis not present

## 2021-07-13 DIAGNOSIS — J301 Allergic rhinitis due to pollen: Secondary | ICD-10-CM | POA: Diagnosis not present

## 2021-07-13 DIAGNOSIS — J3089 Other allergic rhinitis: Secondary | ICD-10-CM | POA: Diagnosis not present

## 2021-07-19 DIAGNOSIS — H2513 Age-related nuclear cataract, bilateral: Secondary | ICD-10-CM | POA: Diagnosis not present

## 2021-07-20 DIAGNOSIS — J3089 Other allergic rhinitis: Secondary | ICD-10-CM | POA: Diagnosis not present

## 2021-07-20 DIAGNOSIS — J3081 Allergic rhinitis due to animal (cat) (dog) hair and dander: Secondary | ICD-10-CM | POA: Diagnosis not present

## 2021-07-20 DIAGNOSIS — J301 Allergic rhinitis due to pollen: Secondary | ICD-10-CM | POA: Diagnosis not present

## 2021-07-28 DIAGNOSIS — E663 Overweight: Secondary | ICD-10-CM | POA: Diagnosis not present

## 2021-07-28 DIAGNOSIS — M810 Age-related osteoporosis without current pathological fracture: Secondary | ICD-10-CM | POA: Diagnosis not present

## 2021-07-28 DIAGNOSIS — I10 Essential (primary) hypertension: Secondary | ICD-10-CM | POA: Diagnosis not present

## 2021-07-28 DIAGNOSIS — E785 Hyperlipidemia, unspecified: Secondary | ICD-10-CM | POA: Diagnosis not present

## 2021-08-03 DIAGNOSIS — J3081 Allergic rhinitis due to animal (cat) (dog) hair and dander: Secondary | ICD-10-CM | POA: Diagnosis not present

## 2021-08-03 DIAGNOSIS — J3089 Other allergic rhinitis: Secondary | ICD-10-CM | POA: Diagnosis not present

## 2021-08-03 DIAGNOSIS — J301 Allergic rhinitis due to pollen: Secondary | ICD-10-CM | POA: Diagnosis not present

## 2021-08-12 DIAGNOSIS — J301 Allergic rhinitis due to pollen: Secondary | ICD-10-CM | POA: Diagnosis not present

## 2021-08-12 DIAGNOSIS — J3089 Other allergic rhinitis: Secondary | ICD-10-CM | POA: Diagnosis not present

## 2021-08-19 DIAGNOSIS — J3081 Allergic rhinitis due to animal (cat) (dog) hair and dander: Secondary | ICD-10-CM | POA: Diagnosis not present

## 2021-08-19 DIAGNOSIS — J301 Allergic rhinitis due to pollen: Secondary | ICD-10-CM | POA: Diagnosis not present

## 2021-08-19 DIAGNOSIS — J3089 Other allergic rhinitis: Secondary | ICD-10-CM | POA: Diagnosis not present

## 2021-08-26 DIAGNOSIS — J3089 Other allergic rhinitis: Secondary | ICD-10-CM | POA: Diagnosis not present

## 2021-08-26 DIAGNOSIS — J301 Allergic rhinitis due to pollen: Secondary | ICD-10-CM | POA: Diagnosis not present

## 2021-09-02 DIAGNOSIS — J301 Allergic rhinitis due to pollen: Secondary | ICD-10-CM | POA: Diagnosis not present

## 2021-09-02 DIAGNOSIS — J3089 Other allergic rhinitis: Secondary | ICD-10-CM | POA: Diagnosis not present

## 2021-09-02 DIAGNOSIS — J3081 Allergic rhinitis due to animal (cat) (dog) hair and dander: Secondary | ICD-10-CM | POA: Diagnosis not present

## 2021-09-08 DIAGNOSIS — J3089 Other allergic rhinitis: Secondary | ICD-10-CM | POA: Diagnosis not present

## 2021-09-08 DIAGNOSIS — J301 Allergic rhinitis due to pollen: Secondary | ICD-10-CM | POA: Diagnosis not present

## 2021-09-10 DIAGNOSIS — J301 Allergic rhinitis due to pollen: Secondary | ICD-10-CM | POA: Diagnosis not present

## 2021-09-10 DIAGNOSIS — J3089 Other allergic rhinitis: Secondary | ICD-10-CM | POA: Diagnosis not present

## 2021-09-21 DIAGNOSIS — J301 Allergic rhinitis due to pollen: Secondary | ICD-10-CM | POA: Diagnosis not present

## 2021-09-21 DIAGNOSIS — J3081 Allergic rhinitis due to animal (cat) (dog) hair and dander: Secondary | ICD-10-CM | POA: Diagnosis not present

## 2021-09-21 DIAGNOSIS — J3089 Other allergic rhinitis: Secondary | ICD-10-CM | POA: Diagnosis not present

## 2021-09-28 DIAGNOSIS — J3089 Other allergic rhinitis: Secondary | ICD-10-CM | POA: Diagnosis not present

## 2021-09-28 DIAGNOSIS — J3081 Allergic rhinitis due to animal (cat) (dog) hair and dander: Secondary | ICD-10-CM | POA: Diagnosis not present

## 2021-09-28 DIAGNOSIS — J301 Allergic rhinitis due to pollen: Secondary | ICD-10-CM | POA: Diagnosis not present

## 2021-10-12 DIAGNOSIS — J3081 Allergic rhinitis due to animal (cat) (dog) hair and dander: Secondary | ICD-10-CM | POA: Diagnosis not present

## 2021-10-12 DIAGNOSIS — J301 Allergic rhinitis due to pollen: Secondary | ICD-10-CM | POA: Diagnosis not present

## 2021-10-12 DIAGNOSIS — J3089 Other allergic rhinitis: Secondary | ICD-10-CM | POA: Diagnosis not present
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
## 2021-10-22 DIAGNOSIS — J3089 Other allergic rhinitis: Secondary | ICD-10-CM | POA: Diagnosis not present

## 2021-10-22 DIAGNOSIS — J301 Allergic rhinitis due to pollen: Secondary | ICD-10-CM | POA: Diagnosis not present

## 2021-10-22 DIAGNOSIS — J3081 Allergic rhinitis due to animal (cat) (dog) hair and dander: Secondary | ICD-10-CM | POA: Diagnosis not present

## 2021-10-27 ENCOUNTER — Ambulatory Visit (INDEPENDENT_AMBULATORY_CARE_PROVIDER_SITE_OTHER): Payer: Medicare PPO | Admitting: Obstetrics and Gynecology

## 2021-10-27 ENCOUNTER — Other Ambulatory Visit (HOSPITAL_COMMUNITY)
Admission: RE | Admit: 2021-10-27 | Discharge: 2021-10-27 | Disposition: A | Discharge status: Home / Self Care | Payer: Medicare PPO | Source: Ambulatory Visit | Attending: Obstetrics and Gynecology | Admitting: Obstetrics and Gynecology

## 2021-10-27 ENCOUNTER — Encounter: Payer: Self-pay | Admitting: Obstetrics and Gynecology

## 2021-10-27 VITALS — BP 118/70 | HR 88 | Ht 63.0 in | Wt 142.0 lb

## 2021-10-27 DIAGNOSIS — Z01419 Encounter for gynecological examination (general) (routine) without abnormal findings: Secondary | ICD-10-CM | POA: Diagnosis not present

## 2021-10-27 DIAGNOSIS — Z1151 Encounter for screening for human papillomavirus (HPV): Secondary | ICD-10-CM | POA: Insufficient documentation

## 2021-10-27 DIAGNOSIS — M810 Age-related osteoporosis without current pathological fracture: Secondary | ICD-10-CM

## 2021-10-27 DIAGNOSIS — Z124 Encounter for screening for malignant neoplasm of cervix: Secondary | ICD-10-CM | POA: Diagnosis not present

## 2021-10-27 DIAGNOSIS — Z853 Personal history of malignant neoplasm of breast: Secondary | ICD-10-CM | POA: Diagnosis not present

## 2021-10-27 NOTE — Patient Instructions (Signed)

## 2021-10-28 DIAGNOSIS — J3081 Allergic rhinitis due to animal (cat) (dog) hair and dander: Secondary | ICD-10-CM | POA: Diagnosis not present

## 2021-10-28 DIAGNOSIS — J301 Allergic rhinitis due to pollen: Secondary | ICD-10-CM | POA: Diagnosis not present

## 2021-10-28 DIAGNOSIS — J3089 Other allergic rhinitis: Secondary | ICD-10-CM | POA: Diagnosis not present

## 2021-10-28 LAB — CYTOLOGY - PAP
Comment: NEGATIVE
Diagnosis: NEGATIVE
High risk HPV: NEGATIVE

## 2021-11-04 DIAGNOSIS — J301 Allergic rhinitis due to pollen: Secondary | ICD-10-CM | POA: Diagnosis not present

## 2021-11-04 DIAGNOSIS — J3089 Other allergic rhinitis: Secondary | ICD-10-CM | POA: Diagnosis not present

## 2021-11-04 DIAGNOSIS — J3081 Allergic rhinitis due to animal (cat) (dog) hair and dander: Secondary | ICD-10-CM | POA: Diagnosis not present

## 2021-11-11 DIAGNOSIS — J3089 Other allergic rhinitis: Secondary | ICD-10-CM | POA: Diagnosis not present

## 2021-11-11 DIAGNOSIS — J301 Allergic rhinitis due to pollen: Secondary | ICD-10-CM | POA: Diagnosis not present

## 2021-11-11 DIAGNOSIS — J3081 Allergic rhinitis due to animal (cat) (dog) hair and dander: Secondary | ICD-10-CM | POA: Diagnosis not present

## 2021-11-18 DIAGNOSIS — J3081 Allergic rhinitis due to animal (cat) (dog) hair and dander: Secondary | ICD-10-CM | POA: Diagnosis not present

## 2021-11-18 DIAGNOSIS — J3089 Other allergic rhinitis: Secondary | ICD-10-CM | POA: Diagnosis not present

## 2021-11-18 DIAGNOSIS — J301 Allergic rhinitis due to pollen: Secondary | ICD-10-CM | POA: Diagnosis not present

## 2021-11-26 DIAGNOSIS — J3089 Other allergic rhinitis: Secondary | ICD-10-CM | POA: Diagnosis not present

## 2021-11-26 DIAGNOSIS — J301 Allergic rhinitis due to pollen: Secondary | ICD-10-CM | POA: Diagnosis not present

## 2021-11-26 DIAGNOSIS — J3081 Allergic rhinitis due to animal (cat) (dog) hair and dander: Secondary | ICD-10-CM | POA: Diagnosis not present

## 2021-12-03 DIAGNOSIS — J3089 Other allergic rhinitis: Secondary | ICD-10-CM | POA: Diagnosis not present

## 2021-12-03 DIAGNOSIS — J3081 Allergic rhinitis due to animal (cat) (dog) hair and dander: Secondary | ICD-10-CM | POA: Diagnosis not present

## 2021-12-03 DIAGNOSIS — J301 Allergic rhinitis due to pollen: Secondary | ICD-10-CM | POA: Diagnosis not present

## 2021-12-07 DIAGNOSIS — Z79899 Other long term (current) drug therapy: Secondary | ICD-10-CM | POA: Diagnosis not present

## 2021-12-07 DIAGNOSIS — M810 Age-related osteoporosis without current pathological fracture: Secondary | ICD-10-CM | POA: Diagnosis not present

## 2021-12-09 DIAGNOSIS — J3089 Other allergic rhinitis: Secondary | ICD-10-CM | POA: Diagnosis not present

## 2021-12-09 DIAGNOSIS — J3081 Allergic rhinitis due to animal (cat) (dog) hair and dander: Secondary | ICD-10-CM | POA: Diagnosis not present

## 2021-12-09 DIAGNOSIS — J301 Allergic rhinitis due to pollen: Secondary | ICD-10-CM | POA: Diagnosis not present

## 2021-12-16 DIAGNOSIS — J301 Allergic rhinitis due to pollen: Secondary | ICD-10-CM | POA: Diagnosis not present

## 2021-12-16 DIAGNOSIS — J3081 Allergic rhinitis due to animal (cat) (dog) hair and dander: Secondary | ICD-10-CM | POA: Diagnosis not present

## 2021-12-16 DIAGNOSIS — J3089 Other allergic rhinitis: Secondary | ICD-10-CM | POA: Diagnosis not present

## 2021-12-22 DIAGNOSIS — J301 Allergic rhinitis due to pollen: Secondary | ICD-10-CM | POA: Diagnosis not present

## 2021-12-22 DIAGNOSIS — J3089 Other allergic rhinitis: Secondary | ICD-10-CM | POA: Diagnosis not present

## 2021-12-22 DIAGNOSIS — J3081 Allergic rhinitis due to animal (cat) (dog) hair and dander: Secondary | ICD-10-CM | POA: Diagnosis not present

## 2021-12-29 DIAGNOSIS — J3081 Allergic rhinitis due to animal (cat) (dog) hair and dander: Secondary | ICD-10-CM | POA: Diagnosis not present

## 2021-12-29 DIAGNOSIS — J3089 Other allergic rhinitis: Secondary | ICD-10-CM | POA: Diagnosis not present

## 2021-12-29 DIAGNOSIS — J301 Allergic rhinitis due to pollen: Secondary | ICD-10-CM | POA: Diagnosis not present

## 2022-01-03 ENCOUNTER — Other Ambulatory Visit (HOSPITAL_COMMUNITY): Payer: Self-pay | Admitting: *Deleted

## 2022-01-04 ENCOUNTER — Ambulatory Visit (HOSPITAL_COMMUNITY)
Admission: RE | Admit: 2022-01-04 | Discharge: 2022-01-04 | Disposition: A | Discharge status: Home / Self Care | Payer: Medicare PPO | Source: Ambulatory Visit | Attending: Internal Medicine | Admitting: Internal Medicine

## 2022-01-04 DIAGNOSIS — M810 Age-related osteoporosis without current pathological fracture: Secondary | ICD-10-CM | POA: Insufficient documentation

## 2022-01-04 MED ORDER — ZOLEDRONIC ACID 5 MG/100ML IV SOLN
5.0000 mg | Freq: Once | INTRAVENOUS | Status: AC
  Administered 2022-01-04: 5 mg via INTRAVENOUS

## 2022-01-04 MED ORDER — ZOLEDRONIC ACID 5 MG/100ML IV SOLN
INTRAVENOUS | Status: AC
  Filled 2022-01-04: qty 100

## 2022-01-05 DIAGNOSIS — J301 Allergic rhinitis due to pollen: Secondary | ICD-10-CM | POA: Diagnosis not present

## 2022-01-05 DIAGNOSIS — J3081 Allergic rhinitis due to animal (cat) (dog) hair and dander: Secondary | ICD-10-CM | POA: Diagnosis not present

## 2022-01-05 DIAGNOSIS — J3089 Other allergic rhinitis: Secondary | ICD-10-CM | POA: Diagnosis not present

## 2022-01-10 DIAGNOSIS — J3089 Other allergic rhinitis: Secondary | ICD-10-CM | POA: Diagnosis not present

## 2022-01-10 DIAGNOSIS — L309 Dermatitis, unspecified: Secondary | ICD-10-CM | POA: Diagnosis not present

## 2022-01-10 DIAGNOSIS — J301 Allergic rhinitis due to pollen: Secondary | ICD-10-CM | POA: Diagnosis not present

## 2022-01-10 DIAGNOSIS — H1045 Other chronic allergic conjunctivitis: Secondary | ICD-10-CM | POA: Diagnosis not present

## 2022-01-11 ENCOUNTER — Other Ambulatory Visit: Payer: Self-pay | Admitting: Internal Medicine

## 2022-01-11 DIAGNOSIS — Z1231 Encounter for screening mammogram for malignant neoplasm of breast: Secondary | ICD-10-CM

## 2022-01-12 DIAGNOSIS — J3089 Other allergic rhinitis: Secondary | ICD-10-CM | POA: Diagnosis not present

## 2022-01-12 DIAGNOSIS — J301 Allergic rhinitis due to pollen: Secondary | ICD-10-CM | POA: Diagnosis not present

## 2022-01-19 DIAGNOSIS — J301 Allergic rhinitis due to pollen: Secondary | ICD-10-CM | POA: Diagnosis not present

## 2022-01-19 DIAGNOSIS — J3081 Allergic rhinitis due to animal (cat) (dog) hair and dander: Secondary | ICD-10-CM | POA: Diagnosis not present

## 2022-01-19 DIAGNOSIS — J3089 Other allergic rhinitis: Secondary | ICD-10-CM | POA: Diagnosis not present

## 2022-01-27 DIAGNOSIS — R7989 Other specified abnormal findings of blood chemistry: Secondary | ICD-10-CM | POA: Diagnosis not present

## 2022-01-27 DIAGNOSIS — E785 Hyperlipidemia, unspecified: Secondary | ICD-10-CM | POA: Diagnosis not present

## 2022-01-27 DIAGNOSIS — M810 Age-related osteoporosis without current pathological fracture: Secondary | ICD-10-CM | POA: Diagnosis not present

## 2022-01-27 DIAGNOSIS — I10 Essential (primary) hypertension: Secondary | ICD-10-CM | POA: Diagnosis not present

## 2022-01-31 DIAGNOSIS — E785 Hyperlipidemia, unspecified: Secondary | ICD-10-CM | POA: Diagnosis not present

## 2022-01-31 DIAGNOSIS — N179 Acute kidney failure, unspecified: Secondary | ICD-10-CM | POA: Diagnosis not present

## 2022-01-31 DIAGNOSIS — Z1339 Encounter for screening examination for other mental health and behavioral disorders: Secondary | ICD-10-CM | POA: Diagnosis not present

## 2022-01-31 DIAGNOSIS — R82998 Other abnormal findings in urine: Secondary | ICD-10-CM | POA: Diagnosis not present

## 2022-01-31 DIAGNOSIS — I10 Essential (primary) hypertension: Secondary | ICD-10-CM | POA: Diagnosis not present

## 2022-01-31 DIAGNOSIS — Z0000 Encounter for general adult medical examination without abnormal findings: Secondary | ICD-10-CM | POA: Diagnosis not present

## 2022-01-31 DIAGNOSIS — E663 Overweight: Secondary | ICD-10-CM | POA: Diagnosis not present

## 2022-01-31 DIAGNOSIS — Z1331 Encounter for screening for depression: Secondary | ICD-10-CM | POA: Diagnosis not present

## 2022-01-31 DIAGNOSIS — D649 Anemia, unspecified: Secondary | ICD-10-CM | POA: Diagnosis not present

## 2022-02-02 DIAGNOSIS — J3089 Other allergic rhinitis: Secondary | ICD-10-CM | POA: Diagnosis not present

## 2022-02-02 DIAGNOSIS — J301 Allergic rhinitis due to pollen: Secondary | ICD-10-CM | POA: Diagnosis not present

## 2022-02-03 ENCOUNTER — Ambulatory Visit: Payer: Medicare PPO

## 2022-02-09 DIAGNOSIS — J3089 Other allergic rhinitis: Secondary | ICD-10-CM | POA: Diagnosis not present

## 2022-02-09 DIAGNOSIS — J3081 Allergic rhinitis due to animal (cat) (dog) hair and dander: Secondary | ICD-10-CM | POA: Diagnosis not present

## 2022-02-09 DIAGNOSIS — J301 Allergic rhinitis due to pollen: Secondary | ICD-10-CM | POA: Diagnosis not present

## 2022-02-10 ENCOUNTER — Ambulatory Visit
Admission: RE | Admit: 2022-02-10 | Discharge: 2022-02-10 | Disposition: A | Discharge status: Home / Self Care | Payer: Medicare PPO | Source: Ambulatory Visit | Attending: Internal Medicine | Admitting: Internal Medicine

## 2022-02-10 DIAGNOSIS — Z1231 Encounter for screening mammogram for malignant neoplasm of breast: Secondary | ICD-10-CM | POA: Diagnosis not present

## 2022-02-15 ENCOUNTER — Other Ambulatory Visit: Payer: Self-pay | Admitting: Internal Medicine

## 2022-02-15 DIAGNOSIS — J301 Allergic rhinitis due to pollen: Secondary | ICD-10-CM | POA: Diagnosis not present

## 2022-02-15 DIAGNOSIS — J3089 Other allergic rhinitis: Secondary | ICD-10-CM | POA: Diagnosis not present

## 2022-02-15 DIAGNOSIS — R928 Other abnormal and inconclusive findings on diagnostic imaging of breast: Secondary | ICD-10-CM

## 2022-02-21 ENCOUNTER — Ambulatory Visit
Admission: RE | Admit: 2022-02-21 | Discharge: 2022-02-21 | Disposition: A | Discharge status: Home / Self Care | Payer: Medicare PPO | Source: Ambulatory Visit | Attending: Internal Medicine | Admitting: Internal Medicine

## 2022-02-21 DIAGNOSIS — R928 Other abnormal and inconclusive findings on diagnostic imaging of breast: Secondary | ICD-10-CM

## 2022-02-21 DIAGNOSIS — N6042 Mammary duct ectasia of left breast: Secondary | ICD-10-CM | POA: Diagnosis not present

## 2022-02-21 DIAGNOSIS — R92332 Mammographic heterogeneous density, left breast: Secondary | ICD-10-CM | POA: Diagnosis not present

## 2022-02-24 DIAGNOSIS — J301 Allergic rhinitis due to pollen: Secondary | ICD-10-CM | POA: Diagnosis not present

## 2022-02-24 DIAGNOSIS — J3089 Other allergic rhinitis: Secondary | ICD-10-CM | POA: Diagnosis not present

## 2022-03-01 DIAGNOSIS — J3081 Allergic rhinitis due to animal (cat) (dog) hair and dander: Secondary | ICD-10-CM | POA: Diagnosis not present

## 2022-03-01 DIAGNOSIS — J301 Allergic rhinitis due to pollen: Secondary | ICD-10-CM | POA: Diagnosis not present

## 2022-03-01 DIAGNOSIS — J3089 Other allergic rhinitis: Secondary | ICD-10-CM | POA: Diagnosis not present

## 2022-03-09 DIAGNOSIS — J3081 Allergic rhinitis due to animal (cat) (dog) hair and dander: Secondary | ICD-10-CM | POA: Diagnosis not present

## 2022-03-09 DIAGNOSIS — J3089 Other allergic rhinitis: Secondary | ICD-10-CM | POA: Diagnosis not present

## 2022-03-09 DIAGNOSIS — J301 Allergic rhinitis due to pollen: Secondary | ICD-10-CM | POA: Diagnosis not present

## 2022-03-15 DIAGNOSIS — J3089 Other allergic rhinitis: Secondary | ICD-10-CM | POA: Diagnosis not present

## 2022-03-15 DIAGNOSIS — J301 Allergic rhinitis due to pollen: Secondary | ICD-10-CM | POA: Diagnosis not present

## 2022-03-15 DIAGNOSIS — J3081 Allergic rhinitis due to animal (cat) (dog) hair and dander: Secondary | ICD-10-CM | POA: Diagnosis not present

## 2022-03-22 DIAGNOSIS — J3081 Allergic rhinitis due to animal (cat) (dog) hair and dander: Secondary | ICD-10-CM | POA: Diagnosis not present

## 2022-03-22 DIAGNOSIS — J301 Allergic rhinitis due to pollen: Secondary | ICD-10-CM | POA: Diagnosis not present

## 2022-03-22 DIAGNOSIS — J3089 Other allergic rhinitis: Secondary | ICD-10-CM | POA: Diagnosis not present

## 2022-03-29 DIAGNOSIS — J3081 Allergic rhinitis due to animal (cat) (dog) hair and dander: Secondary | ICD-10-CM | POA: Diagnosis not present

## 2022-03-29 DIAGNOSIS — J3089 Other allergic rhinitis: Secondary | ICD-10-CM | POA: Diagnosis not present

## 2022-03-29 DIAGNOSIS — J301 Allergic rhinitis due to pollen: Secondary | ICD-10-CM | POA: Diagnosis not present

## 2022-04-05 DIAGNOSIS — J301 Allergic rhinitis due to pollen: Secondary | ICD-10-CM | POA: Diagnosis not present

## 2022-04-05 DIAGNOSIS — J3089 Other allergic rhinitis: Secondary | ICD-10-CM | POA: Diagnosis not present

## 2022-04-05 DIAGNOSIS — J3081 Allergic rhinitis due to animal (cat) (dog) hair and dander: Secondary | ICD-10-CM | POA: Diagnosis not present

## 2022-04-14 DIAGNOSIS — J3081 Allergic rhinitis due to animal (cat) (dog) hair and dander: Secondary | ICD-10-CM | POA: Diagnosis not present

## 2022-04-14 DIAGNOSIS — J301 Allergic rhinitis due to pollen: Secondary | ICD-10-CM | POA: Diagnosis not present

## 2022-04-14 DIAGNOSIS — J3089 Other allergic rhinitis: Secondary | ICD-10-CM | POA: Diagnosis not present

## 2022-04-20 DIAGNOSIS — J3089 Other allergic rhinitis: Secondary | ICD-10-CM | POA: Diagnosis not present

## 2022-04-20 DIAGNOSIS — J301 Allergic rhinitis due to pollen: Secondary | ICD-10-CM | POA: Diagnosis not present

## 2022-04-20 DIAGNOSIS — J3081 Allergic rhinitis due to animal (cat) (dog) hair and dander: Secondary | ICD-10-CM | POA: Diagnosis not present

## 2022-04-27 DIAGNOSIS — J3089 Other allergic rhinitis: Secondary | ICD-10-CM | POA: Diagnosis not present

## 2022-04-27 DIAGNOSIS — J301 Allergic rhinitis due to pollen: Secondary | ICD-10-CM | POA: Diagnosis not present

## 2022-05-04 DIAGNOSIS — J301 Allergic rhinitis due to pollen: Secondary | ICD-10-CM | POA: Diagnosis not present

## 2022-05-04 DIAGNOSIS — J3089 Other allergic rhinitis: Secondary | ICD-10-CM | POA: Diagnosis not present

## 2022-05-11 DIAGNOSIS — J3089 Other allergic rhinitis: Secondary | ICD-10-CM | POA: Diagnosis not present

## 2022-05-11 DIAGNOSIS — J301 Allergic rhinitis due to pollen: Secondary | ICD-10-CM | POA: Diagnosis not present

## 2022-05-17 DIAGNOSIS — J3089 Other allergic rhinitis: Secondary | ICD-10-CM | POA: Diagnosis not present

## 2022-05-17 DIAGNOSIS — J3081 Allergic rhinitis due to animal (cat) (dog) hair and dander: Secondary | ICD-10-CM | POA: Diagnosis not present

## 2022-05-17 DIAGNOSIS — J301 Allergic rhinitis due to pollen: Secondary | ICD-10-CM | POA: Diagnosis not present

## 2022-05-24 DIAGNOSIS — J301 Allergic rhinitis due to pollen: Secondary | ICD-10-CM | POA: Diagnosis not present

## 2022-05-24 DIAGNOSIS — J3089 Other allergic rhinitis: Secondary | ICD-10-CM | POA: Diagnosis not present

## 2022-05-30 DIAGNOSIS — L814 Other melanin hyperpigmentation: Secondary | ICD-10-CM | POA: Diagnosis not present

## 2022-05-30 DIAGNOSIS — L821 Other seborrheic keratosis: Secondary | ICD-10-CM | POA: Diagnosis not present

## 2022-05-30 DIAGNOSIS — D225 Melanocytic nevi of trunk: Secondary | ICD-10-CM | POA: Diagnosis not present

## 2022-05-31 DIAGNOSIS — J3089 Other allergic rhinitis: Secondary | ICD-10-CM | POA: Diagnosis not present

## 2022-05-31 DIAGNOSIS — J301 Allergic rhinitis due to pollen: Secondary | ICD-10-CM | POA: Diagnosis not present

## 2022-05-31 DIAGNOSIS — J3081 Allergic rhinitis due to animal (cat) (dog) hair and dander: Secondary | ICD-10-CM | POA: Diagnosis not present

## 2022-06-09 DIAGNOSIS — J3089 Other allergic rhinitis: Secondary | ICD-10-CM | POA: Diagnosis not present

## 2022-06-09 DIAGNOSIS — J301 Allergic rhinitis due to pollen: Secondary | ICD-10-CM | POA: Diagnosis not present

## 2022-06-09 DIAGNOSIS — J3081 Allergic rhinitis due to animal (cat) (dog) hair and dander: Secondary | ICD-10-CM | POA: Diagnosis not present

## 2022-06-16 DIAGNOSIS — J301 Allergic rhinitis due to pollen: Secondary | ICD-10-CM | POA: Diagnosis not present

## 2022-06-16 DIAGNOSIS — J3081 Allergic rhinitis due to animal (cat) (dog) hair and dander: Secondary | ICD-10-CM | POA: Diagnosis not present

## 2022-06-16 DIAGNOSIS — J3089 Other allergic rhinitis: Secondary | ICD-10-CM | POA: Diagnosis not present

## 2022-06-23 DIAGNOSIS — J3081 Allergic rhinitis due to animal (cat) (dog) hair and dander: Secondary | ICD-10-CM | POA: Diagnosis not present

## 2022-06-23 DIAGNOSIS — J301 Allergic rhinitis due to pollen: Secondary | ICD-10-CM | POA: Diagnosis not present

## 2022-06-23 DIAGNOSIS — J3089 Other allergic rhinitis: Secondary | ICD-10-CM | POA: Diagnosis not present

## 2022-06-30 DIAGNOSIS — J301 Allergic rhinitis due to pollen: Secondary | ICD-10-CM | POA: Diagnosis not present

## 2022-06-30 DIAGNOSIS — J3081 Allergic rhinitis due to animal (cat) (dog) hair and dander: Secondary | ICD-10-CM | POA: Diagnosis not present

## 2022-06-30 DIAGNOSIS — J3089 Other allergic rhinitis: Secondary | ICD-10-CM | POA: Diagnosis not present

## 2022-07-06 DIAGNOSIS — J3089 Other allergic rhinitis: Secondary | ICD-10-CM | POA: Diagnosis not present

## 2022-07-06 DIAGNOSIS — J3081 Allergic rhinitis due to animal (cat) (dog) hair and dander: Secondary | ICD-10-CM | POA: Diagnosis not present

## 2022-07-06 DIAGNOSIS — J301 Allergic rhinitis due to pollen: Secondary | ICD-10-CM | POA: Diagnosis not present

## 2022-07-13 DIAGNOSIS — J3081 Allergic rhinitis due to animal (cat) (dog) hair and dander: Secondary | ICD-10-CM | POA: Diagnosis not present

## 2022-07-13 DIAGNOSIS — J301 Allergic rhinitis due to pollen: Secondary | ICD-10-CM | POA: Diagnosis not present

## 2022-07-13 DIAGNOSIS — J3089 Other allergic rhinitis: Secondary | ICD-10-CM | POA: Diagnosis not present

## 2022-07-20 DIAGNOSIS — J3081 Allergic rhinitis due to animal (cat) (dog) hair and dander: Secondary | ICD-10-CM | POA: Diagnosis not present

## 2022-07-20 DIAGNOSIS — J301 Allergic rhinitis due to pollen: Secondary | ICD-10-CM | POA: Diagnosis not present

## 2022-07-20 DIAGNOSIS — J3089 Other allergic rhinitis: Secondary | ICD-10-CM | POA: Diagnosis not present

## 2022-07-26 DIAGNOSIS — J301 Allergic rhinitis due to pollen: Secondary | ICD-10-CM | POA: Diagnosis not present

## 2022-07-26 DIAGNOSIS — J3089 Other allergic rhinitis: Secondary | ICD-10-CM | POA: Diagnosis not present

## 2022-08-08 DIAGNOSIS — D638 Anemia in other chronic diseases classified elsewhere: Secondary | ICD-10-CM | POA: Diagnosis not present

## 2022-08-08 DIAGNOSIS — C50919 Malignant neoplasm of unspecified site of unspecified female breast: Secondary | ICD-10-CM | POA: Diagnosis not present

## 2022-08-08 DIAGNOSIS — I10 Essential (primary) hypertension: Secondary | ICD-10-CM | POA: Diagnosis not present

## 2022-08-08 DIAGNOSIS — F32A Depression, unspecified: Secondary | ICD-10-CM | POA: Diagnosis not present

## 2022-08-09 DIAGNOSIS — J3081 Allergic rhinitis due to animal (cat) (dog) hair and dander: Secondary | ICD-10-CM | POA: Diagnosis not present

## 2022-08-09 DIAGNOSIS — J301 Allergic rhinitis due to pollen: Secondary | ICD-10-CM | POA: Diagnosis not present

## 2022-08-09 DIAGNOSIS — J3089 Other allergic rhinitis: Secondary | ICD-10-CM | POA: Diagnosis not present

## 2022-08-11 DIAGNOSIS — J301 Allergic rhinitis due to pollen: Secondary | ICD-10-CM | POA: Diagnosis not present

## 2022-08-11 DIAGNOSIS — J3089 Other allergic rhinitis: Secondary | ICD-10-CM | POA: Diagnosis not present

## 2022-08-16 DIAGNOSIS — J3081 Allergic rhinitis due to animal (cat) (dog) hair and dander: Secondary | ICD-10-CM | POA: Diagnosis not present

## 2022-08-16 DIAGNOSIS — J3089 Other allergic rhinitis: Secondary | ICD-10-CM | POA: Diagnosis not present

## 2022-08-16 DIAGNOSIS — J301 Allergic rhinitis due to pollen: Secondary | ICD-10-CM | POA: Diagnosis not present

## 2022-08-16 DIAGNOSIS — H3540 Unspecified peripheral retinal degeneration: Secondary | ICD-10-CM | POA: Diagnosis not present

## 2022-08-23 DIAGNOSIS — J301 Allergic rhinitis due to pollen: Secondary | ICD-10-CM | POA: Diagnosis not present

## 2022-08-23 DIAGNOSIS — J3081 Allergic rhinitis due to animal (cat) (dog) hair and dander: Secondary | ICD-10-CM | POA: Diagnosis not present

## 2022-08-23 DIAGNOSIS — J3089 Other allergic rhinitis: Secondary | ICD-10-CM | POA: Diagnosis not present

## 2022-08-30 DIAGNOSIS — J3081 Allergic rhinitis due to animal (cat) (dog) hair and dander: Secondary | ICD-10-CM | POA: Diagnosis not present

## 2022-08-30 DIAGNOSIS — J3089 Other allergic rhinitis: Secondary | ICD-10-CM | POA: Diagnosis not present

## 2022-08-30 DIAGNOSIS — J301 Allergic rhinitis due to pollen: Secondary | ICD-10-CM | POA: Diagnosis not present

## 2022-09-06 DIAGNOSIS — J3081 Allergic rhinitis due to animal (cat) (dog) hair and dander: Secondary | ICD-10-CM | POA: Diagnosis not present

## 2022-09-06 DIAGNOSIS — J301 Allergic rhinitis due to pollen: Secondary | ICD-10-CM | POA: Diagnosis not present

## 2022-09-06 DIAGNOSIS — J3089 Other allergic rhinitis: Secondary | ICD-10-CM | POA: Diagnosis not present

## 2022-09-14 DIAGNOSIS — J3081 Allergic rhinitis due to animal (cat) (dog) hair and dander: Secondary | ICD-10-CM | POA: Diagnosis not present

## 2022-09-14 DIAGNOSIS — J3089 Other allergic rhinitis: Secondary | ICD-10-CM | POA: Diagnosis not present

## 2022-09-14 DIAGNOSIS — J301 Allergic rhinitis due to pollen: Secondary | ICD-10-CM | POA: Diagnosis not present

## 2022-09-21 DIAGNOSIS — J301 Allergic rhinitis due to pollen: Secondary | ICD-10-CM | POA: Diagnosis not present

## 2022-09-21 DIAGNOSIS — J3089 Other allergic rhinitis: Secondary | ICD-10-CM | POA: Diagnosis not present

## 2022-09-28 DIAGNOSIS — J301 Allergic rhinitis due to pollen: Secondary | ICD-10-CM | POA: Diagnosis not present

## 2022-09-28 DIAGNOSIS — J3089 Other allergic rhinitis: Secondary | ICD-10-CM | POA: Diagnosis not present

## 2022-10-10 DIAGNOSIS — J301 Allergic rhinitis due to pollen: Secondary | ICD-10-CM | POA: Diagnosis not present

## 2022-10-10 DIAGNOSIS — J3081 Allergic rhinitis due to animal (cat) (dog) hair and dander: Secondary | ICD-10-CM | POA: Diagnosis not present

## 2022-10-10 DIAGNOSIS — J3089 Other allergic rhinitis: Secondary | ICD-10-CM | POA: Diagnosis not present

## 2022-10-17 DIAGNOSIS — J3089 Other allergic rhinitis: Secondary | ICD-10-CM | POA: Diagnosis not present

## 2022-10-17 DIAGNOSIS — J301 Allergic rhinitis due to pollen: Secondary | ICD-10-CM | POA: Diagnosis not present

## 2022-10-17 DIAGNOSIS — J3081 Allergic rhinitis due to animal (cat) (dog) hair and dander: Secondary | ICD-10-CM | POA: Diagnosis not present

## 2022-10-24 DIAGNOSIS — J3089 Other allergic rhinitis: Secondary | ICD-10-CM | POA: Diagnosis not present

## 2022-10-24 DIAGNOSIS — J301 Allergic rhinitis due to pollen: Secondary | ICD-10-CM | POA: Diagnosis not present

## 2022-10-24 DIAGNOSIS — J3081 Allergic rhinitis due to animal (cat) (dog) hair and dander: Secondary | ICD-10-CM | POA: Diagnosis not present

## 2022-10-31 DIAGNOSIS — J301 Allergic rhinitis due to pollen: Secondary | ICD-10-CM | POA: Diagnosis not present

## 2022-10-31 DIAGNOSIS — J3081 Allergic rhinitis due to animal (cat) (dog) hair and dander: Secondary | ICD-10-CM | POA: Diagnosis not present

## 2022-10-31 DIAGNOSIS — J3089 Other allergic rhinitis: Secondary | ICD-10-CM | POA: Diagnosis not present

## 2022-11-07 DIAGNOSIS — J3081 Allergic rhinitis due to animal (cat) (dog) hair and dander: Secondary | ICD-10-CM | POA: Diagnosis not present

## 2022-11-07 DIAGNOSIS — J3089 Other allergic rhinitis: Secondary | ICD-10-CM | POA: Diagnosis not present

## 2022-11-07 DIAGNOSIS — J301 Allergic rhinitis due to pollen: Secondary | ICD-10-CM | POA: Diagnosis not present

## 2022-11-14 DIAGNOSIS — J301 Allergic rhinitis due to pollen: Secondary | ICD-10-CM | POA: Diagnosis not present

## 2022-11-14 DIAGNOSIS — J3081 Allergic rhinitis due to animal (cat) (dog) hair and dander: Secondary | ICD-10-CM | POA: Diagnosis not present

## 2022-11-14 DIAGNOSIS — J3089 Other allergic rhinitis: Secondary | ICD-10-CM | POA: Diagnosis not present

## 2022-11-21 DIAGNOSIS — J3081 Allergic rhinitis due to animal (cat) (dog) hair and dander: Secondary | ICD-10-CM | POA: Diagnosis not present

## 2022-11-21 DIAGNOSIS — J3089 Other allergic rhinitis: Secondary | ICD-10-CM | POA: Diagnosis not present

## 2022-11-21 DIAGNOSIS — J301 Allergic rhinitis due to pollen: Secondary | ICD-10-CM | POA: Diagnosis not present

## 2022-11-29 DIAGNOSIS — J301 Allergic rhinitis due to pollen: Secondary | ICD-10-CM | POA: Diagnosis not present

## 2022-11-29 DIAGNOSIS — J3089 Other allergic rhinitis: Secondary | ICD-10-CM | POA: Diagnosis not present

## 2022-11-29 DIAGNOSIS — J3081 Allergic rhinitis due to animal (cat) (dog) hair and dander: Secondary | ICD-10-CM | POA: Diagnosis not present

## 2022-12-06 DIAGNOSIS — J3081 Allergic rhinitis due to animal (cat) (dog) hair and dander: Secondary | ICD-10-CM | POA: Diagnosis not present

## 2022-12-06 DIAGNOSIS — J301 Allergic rhinitis due to pollen: Secondary | ICD-10-CM | POA: Diagnosis not present

## 2022-12-06 DIAGNOSIS — J3089 Other allergic rhinitis: Secondary | ICD-10-CM | POA: Diagnosis not present

## 2022-12-13 DIAGNOSIS — J301 Allergic rhinitis due to pollen: Secondary | ICD-10-CM | POA: Diagnosis not present

## 2022-12-13 DIAGNOSIS — J3081 Allergic rhinitis due to animal (cat) (dog) hair and dander: Secondary | ICD-10-CM | POA: Diagnosis not present

## 2022-12-13 DIAGNOSIS — J3089 Other allergic rhinitis: Secondary | ICD-10-CM | POA: Diagnosis not present

## 2022-12-15 DIAGNOSIS — M810 Age-related osteoporosis without current pathological fracture: Secondary | ICD-10-CM | POA: Diagnosis not present

## 2022-12-15 DIAGNOSIS — Z79899 Other long term (current) drug therapy: Secondary | ICD-10-CM | POA: Diagnosis not present

## 2022-12-19 DIAGNOSIS — K1120 Sialoadenitis, unspecified: Secondary | ICD-10-CM | POA: Diagnosis not present

## 2022-12-19 DIAGNOSIS — H9201 Otalgia, right ear: Secondary | ICD-10-CM | POA: Diagnosis not present

## 2022-12-19 DIAGNOSIS — R5383 Other fatigue: Secondary | ICD-10-CM | POA: Diagnosis not present

## 2022-12-19 DIAGNOSIS — R0981 Nasal congestion: Secondary | ICD-10-CM | POA: Diagnosis not present

## 2022-12-19 DIAGNOSIS — R058 Other specified cough: Secondary | ICD-10-CM | POA: Diagnosis not present

## 2022-12-19 DIAGNOSIS — Z1152 Encounter for screening for COVID-19: Secondary | ICD-10-CM | POA: Diagnosis not present

## 2022-12-19 DIAGNOSIS — I10 Essential (primary) hypertension: Secondary | ICD-10-CM | POA: Diagnosis not present

## 2022-12-29 DIAGNOSIS — J3089 Other allergic rhinitis: Secondary | ICD-10-CM | POA: Diagnosis not present

## 2022-12-29 DIAGNOSIS — J301 Allergic rhinitis due to pollen: Secondary | ICD-10-CM | POA: Diagnosis not present

## 2023-01-04 DIAGNOSIS — J301 Allergic rhinitis due to pollen: Secondary | ICD-10-CM | POA: Diagnosis not present

## 2023-01-04 DIAGNOSIS — J3089 Other allergic rhinitis: Secondary | ICD-10-CM | POA: Diagnosis not present

## 2023-01-09 ENCOUNTER — Other Ambulatory Visit: Payer: Self-pay | Admitting: Internal Medicine

## 2023-01-09 DIAGNOSIS — J301 Allergic rhinitis due to pollen: Secondary | ICD-10-CM | POA: Diagnosis not present

## 2023-01-09 DIAGNOSIS — L309 Dermatitis, unspecified: Secondary | ICD-10-CM | POA: Diagnosis not present

## 2023-01-09 DIAGNOSIS — H1045 Other chronic allergic conjunctivitis: Secondary | ICD-10-CM | POA: Diagnosis not present

## 2023-01-09 DIAGNOSIS — Z1231 Encounter for screening mammogram for malignant neoplasm of breast: Secondary | ICD-10-CM

## 2023-01-09 DIAGNOSIS — J3089 Other allergic rhinitis: Secondary | ICD-10-CM | POA: Diagnosis not present

## 2023-01-10 ENCOUNTER — Other Ambulatory Visit (HOSPITAL_COMMUNITY): Payer: Self-pay | Admitting: *Deleted

## 2023-01-12 ENCOUNTER — Ambulatory Visit (HOSPITAL_COMMUNITY)
Admission: RE | Admit: 2023-01-12 | Discharge: 2023-01-12 | Disposition: A | Discharge status: Home / Self Care | Payer: Medicare PPO | Source: Ambulatory Visit | Attending: Internal Medicine | Admitting: Internal Medicine

## 2023-01-12 DIAGNOSIS — M810 Age-related osteoporosis without current pathological fracture: Secondary | ICD-10-CM | POA: Insufficient documentation

## 2023-01-12 MED ORDER — ZOLEDRONIC ACID 5 MG/100ML IV SOLN
INTRAVENOUS | Status: AC
  Administered 2023-01-12: 5 mg via INTRAVENOUS
  Filled 2023-01-12: qty 100

## 2023-01-12 MED ORDER — ZOLEDRONIC ACID 5 MG/100ML IV SOLN: 5.0000 mg | Freq: Once | INTRAVENOUS | Status: AC

## 2023-01-13 DIAGNOSIS — J301 Allergic rhinitis due to pollen: Secondary | ICD-10-CM | POA: Diagnosis not present

## 2023-01-13 DIAGNOSIS — J3089 Other allergic rhinitis: Secondary | ICD-10-CM | POA: Diagnosis not present

## 2023-01-13 DIAGNOSIS — J3081 Allergic rhinitis due to animal (cat) (dog) hair and dander: Secondary | ICD-10-CM | POA: Diagnosis not present

## 2023-01-20 DIAGNOSIS — J301 Allergic rhinitis due to pollen: Secondary | ICD-10-CM | POA: Diagnosis not present

## 2023-01-20 DIAGNOSIS — J3081 Allergic rhinitis due to animal (cat) (dog) hair and dander: Secondary | ICD-10-CM | POA: Diagnosis not present

## 2023-01-20 DIAGNOSIS — J3089 Other allergic rhinitis: Secondary | ICD-10-CM | POA: Diagnosis not present

## 2023-01-27 DIAGNOSIS — J301 Allergic rhinitis due to pollen: Secondary | ICD-10-CM | POA: Diagnosis not present

## 2023-01-27 DIAGNOSIS — J3081 Allergic rhinitis due to animal (cat) (dog) hair and dander: Secondary | ICD-10-CM | POA: Diagnosis not present

## 2023-01-27 DIAGNOSIS — J3089 Other allergic rhinitis: Secondary | ICD-10-CM | POA: Diagnosis not present

## 2023-01-30 DIAGNOSIS — Z1389 Encounter for screening for other disorder: Secondary | ICD-10-CM | POA: Diagnosis not present

## 2023-01-30 DIAGNOSIS — M810 Age-related osteoporosis without current pathological fracture: Secondary | ICD-10-CM | POA: Diagnosis not present

## 2023-01-30 DIAGNOSIS — I10 Essential (primary) hypertension: Secondary | ICD-10-CM | POA: Diagnosis not present

## 2023-01-30 DIAGNOSIS — Z1212 Encounter for screening for malignant neoplasm of rectum: Secondary | ICD-10-CM | POA: Diagnosis not present

## 2023-01-30 DIAGNOSIS — E785 Hyperlipidemia, unspecified: Secondary | ICD-10-CM | POA: Diagnosis not present

## 2023-01-30 DIAGNOSIS — D638 Anemia in other chronic diseases classified elsewhere: Secondary | ICD-10-CM | POA: Diagnosis not present
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
## 2023-02-02 DIAGNOSIS — J3081 Allergic rhinitis due to animal (cat) (dog) hair and dander: Secondary | ICD-10-CM | POA: Diagnosis not present

## 2023-02-02 DIAGNOSIS — J301 Allergic rhinitis due to pollen: Secondary | ICD-10-CM | POA: Diagnosis not present

## 2023-02-02 DIAGNOSIS — J3089 Other allergic rhinitis: Secondary | ICD-10-CM | POA: Diagnosis not present

## 2023-02-13 ENCOUNTER — Ambulatory Visit: Payer: Medicare PPO | Admitting: Obstetrics and Gynecology

## 2023-02-13 DIAGNOSIS — M5412 Radiculopathy, cervical region: Secondary | ICD-10-CM | POA: Diagnosis not present

## 2023-02-13 DIAGNOSIS — Z1331 Encounter for screening for depression: Secondary | ICD-10-CM | POA: Diagnosis not present

## 2023-02-13 DIAGNOSIS — M810 Age-related osteoporosis without current pathological fracture: Secondary | ICD-10-CM | POA: Diagnosis not present

## 2023-02-13 DIAGNOSIS — C3490 Malignant neoplasm of unspecified part of unspecified bronchus or lung: Secondary | ICD-10-CM | POA: Diagnosis not present

## 2023-02-13 DIAGNOSIS — Z0000 Encounter for general adult medical examination without abnormal findings: Secondary | ICD-10-CM | POA: Diagnosis not present

## 2023-02-13 DIAGNOSIS — E785 Hyperlipidemia, unspecified: Secondary | ICD-10-CM | POA: Diagnosis not present

## 2023-02-13 DIAGNOSIS — E663 Overweight: Secondary | ICD-10-CM | POA: Diagnosis not present

## 2023-02-13 DIAGNOSIS — Z1339 Encounter for screening examination for other mental health and behavioral disorders: Secondary | ICD-10-CM | POA: Diagnosis not present

## 2023-02-13 DIAGNOSIS — C50919 Malignant neoplasm of unspecified site of unspecified female breast: Secondary | ICD-10-CM | POA: Diagnosis not present

## 2023-02-13 DIAGNOSIS — R82998 Other abnormal findings in urine: Secondary | ICD-10-CM | POA: Diagnosis not present

## 2023-02-13 DIAGNOSIS — R7989 Other specified abnormal findings of blood chemistry: Secondary | ICD-10-CM | POA: Diagnosis not present

## 2023-02-13 DIAGNOSIS — I10 Essential (primary) hypertension: Secondary | ICD-10-CM | POA: Diagnosis not present

## 2023-02-13 DIAGNOSIS — D638 Anemia in other chronic diseases classified elsewhere: Secondary | ICD-10-CM | POA: Diagnosis not present

## 2023-02-14 DIAGNOSIS — J3089 Other allergic rhinitis: Secondary | ICD-10-CM | POA: Diagnosis not present

## 2023-02-14 DIAGNOSIS — J3081 Allergic rhinitis due to animal (cat) (dog) hair and dander: Secondary | ICD-10-CM | POA: Diagnosis not present

## 2023-02-14 DIAGNOSIS — J301 Allergic rhinitis due to pollen: Secondary | ICD-10-CM | POA: Diagnosis not present

## 2023-02-21 DIAGNOSIS — J301 Allergic rhinitis due to pollen: Secondary | ICD-10-CM | POA: Diagnosis not present

## 2023-02-21 DIAGNOSIS — J3081 Allergic rhinitis due to animal (cat) (dog) hair and dander: Secondary | ICD-10-CM | POA: Diagnosis not present

## 2023-02-21 DIAGNOSIS — J3089 Other allergic rhinitis: Secondary | ICD-10-CM | POA: Diagnosis not present

## 2023-02-22 ENCOUNTER — Encounter: Payer: Self-pay | Admitting: Internal Medicine

## 2023-02-27 ENCOUNTER — Ambulatory Visit
Admission: RE | Admit: 2023-02-27 | Discharge: 2023-02-27 | Disposition: A | Discharge status: Home / Self Care | Payer: Medicare PPO | Source: Ambulatory Visit | Attending: Internal Medicine | Admitting: Internal Medicine

## 2023-02-27 ENCOUNTER — Ambulatory Visit: Payer: Medicare PPO

## 2023-02-27 DIAGNOSIS — Z1231 Encounter for screening mammogram for malignant neoplasm of breast: Secondary | ICD-10-CM | POA: Diagnosis not present

## 2023-02-27 DIAGNOSIS — M5412 Radiculopathy, cervical region: Secondary | ICD-10-CM | POA: Diagnosis not present

## 2023-02-27 DIAGNOSIS — M542 Cervicalgia: Secondary | ICD-10-CM | POA: Diagnosis not present

## 2023-02-28 DIAGNOSIS — J301 Allergic rhinitis due to pollen: Secondary | ICD-10-CM | POA: Diagnosis not present

## 2023-02-28 DIAGNOSIS — J3089 Other allergic rhinitis: Secondary | ICD-10-CM | POA: Diagnosis not present

## 2023-03-02 DIAGNOSIS — M5412 Radiculopathy, cervical region: Secondary | ICD-10-CM | POA: Diagnosis not present

## 2023-03-02 DIAGNOSIS — M542 Cervicalgia: Secondary | ICD-10-CM | POA: Diagnosis not present

## 2023-03-08 DIAGNOSIS — J301 Allergic rhinitis due to pollen: Secondary | ICD-10-CM | POA: Diagnosis not present

## 2023-03-08 DIAGNOSIS — J3089 Other allergic rhinitis: Secondary | ICD-10-CM | POA: Diagnosis not present

## 2023-03-09 DIAGNOSIS — M542 Cervicalgia: Secondary | ICD-10-CM | POA: Diagnosis not present

## 2023-03-09 DIAGNOSIS — M5412 Radiculopathy, cervical region: Secondary | ICD-10-CM | POA: Diagnosis not present

## 2023-03-13 DIAGNOSIS — J3089 Other allergic rhinitis: Secondary | ICD-10-CM | POA: Diagnosis not present

## 2023-03-13 DIAGNOSIS — J3081 Allergic rhinitis due to animal (cat) (dog) hair and dander: Secondary | ICD-10-CM | POA: Diagnosis not present

## 2023-03-13 DIAGNOSIS — J301 Allergic rhinitis due to pollen: Secondary | ICD-10-CM | POA: Diagnosis not present

## 2023-03-20 DIAGNOSIS — J3089 Other allergic rhinitis: Secondary | ICD-10-CM | POA: Diagnosis not present

## 2023-03-20 DIAGNOSIS — J301 Allergic rhinitis due to pollen: Secondary | ICD-10-CM | POA: Diagnosis not present

## 2023-03-20 DIAGNOSIS — J3081 Allergic rhinitis due to animal (cat) (dog) hair and dander: Secondary | ICD-10-CM | POA: Diagnosis not present

## 2023-03-27 DIAGNOSIS — J3089 Other allergic rhinitis: Secondary | ICD-10-CM | POA: Diagnosis not present

## 2023-03-27 DIAGNOSIS — J301 Allergic rhinitis due to pollen: Secondary | ICD-10-CM | POA: Diagnosis not present

## 2023-03-27 DIAGNOSIS — J3081 Allergic rhinitis due to animal (cat) (dog) hair and dander: Secondary | ICD-10-CM | POA: Diagnosis not present

## 2023-04-04 DIAGNOSIS — J301 Allergic rhinitis due to pollen: Secondary | ICD-10-CM | POA: Diagnosis not present

## 2023-04-04 DIAGNOSIS — J3089 Other allergic rhinitis: Secondary | ICD-10-CM | POA: Diagnosis not present

## 2023-04-04 DIAGNOSIS — J3081 Allergic rhinitis due to animal (cat) (dog) hair and dander: Secondary | ICD-10-CM | POA: Diagnosis not present

## 2023-04-10 DIAGNOSIS — J3089 Other allergic rhinitis: Secondary | ICD-10-CM | POA: Diagnosis not present

## 2023-04-10 DIAGNOSIS — J301 Allergic rhinitis due to pollen: Secondary | ICD-10-CM | POA: Diagnosis not present

## 2023-04-10 DIAGNOSIS — J3081 Allergic rhinitis due to animal (cat) (dog) hair and dander: Secondary | ICD-10-CM | POA: Diagnosis not present

## 2023-04-17 DIAGNOSIS — J3081 Allergic rhinitis due to animal (cat) (dog) hair and dander: Secondary | ICD-10-CM | POA: Diagnosis not present

## 2023-04-17 DIAGNOSIS — J301 Allergic rhinitis due to pollen: Secondary | ICD-10-CM | POA: Diagnosis not present

## 2023-04-17 DIAGNOSIS — J3089 Other allergic rhinitis: Secondary | ICD-10-CM | POA: Diagnosis not present

## 2023-04-24 DIAGNOSIS — J3081 Allergic rhinitis due to animal (cat) (dog) hair and dander: Secondary | ICD-10-CM | POA: Diagnosis not present

## 2023-04-24 DIAGNOSIS — J3089 Other allergic rhinitis: Secondary | ICD-10-CM | POA: Diagnosis not present

## 2023-04-24 DIAGNOSIS — J301 Allergic rhinitis due to pollen: Secondary | ICD-10-CM | POA: Diagnosis not present

## 2023-05-02 DIAGNOSIS — J3089 Other allergic rhinitis: Secondary | ICD-10-CM | POA: Diagnosis not present

## 2023-05-02 DIAGNOSIS — J3081 Allergic rhinitis due to animal (cat) (dog) hair and dander: Secondary | ICD-10-CM | POA: Diagnosis not present

## 2023-05-02 DIAGNOSIS — J301 Allergic rhinitis due to pollen: Secondary | ICD-10-CM | POA: Diagnosis not present

## 2023-05-10 DIAGNOSIS — J301 Allergic rhinitis due to pollen: Secondary | ICD-10-CM | POA: Diagnosis not present

## 2023-05-10 DIAGNOSIS — J3081 Allergic rhinitis due to animal (cat) (dog) hair and dander: Secondary | ICD-10-CM | POA: Diagnosis not present

## 2023-05-10 DIAGNOSIS — J3089 Other allergic rhinitis: Secondary | ICD-10-CM | POA: Diagnosis not present

## 2023-05-16 DIAGNOSIS — J3081 Allergic rhinitis due to animal (cat) (dog) hair and dander: Secondary | ICD-10-CM | POA: Diagnosis not present

## 2023-05-16 DIAGNOSIS — J301 Allergic rhinitis due to pollen: Secondary | ICD-10-CM | POA: Diagnosis not present

## 2023-05-16 DIAGNOSIS — J3089 Other allergic rhinitis: Secondary | ICD-10-CM | POA: Diagnosis not present

## 2023-05-23 DIAGNOSIS — J3081 Allergic rhinitis due to animal (cat) (dog) hair and dander: Secondary | ICD-10-CM | POA: Diagnosis not present

## 2023-05-23 DIAGNOSIS — J301 Allergic rhinitis due to pollen: Secondary | ICD-10-CM | POA: Diagnosis not present

## 2023-05-23 DIAGNOSIS — J3089 Other allergic rhinitis: Secondary | ICD-10-CM | POA: Diagnosis not present
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
## 2023-05-30 DIAGNOSIS — J3081 Allergic rhinitis due to animal (cat) (dog) hair and dander: Secondary | ICD-10-CM | POA: Diagnosis not present

## 2023-05-30 DIAGNOSIS — J3089 Other allergic rhinitis: Secondary | ICD-10-CM | POA: Diagnosis not present

## 2023-05-30 DIAGNOSIS — J301 Allergic rhinitis due to pollen: Secondary | ICD-10-CM | POA: Diagnosis not present

## 2023-06-06 ENCOUNTER — Encounter: Payer: Self-pay | Admitting: Obstetrics and Gynecology

## 2023-06-06 ENCOUNTER — Ambulatory Visit (INDEPENDENT_AMBULATORY_CARE_PROVIDER_SITE_OTHER): Payer: Medicare PPO | Admitting: Obstetrics and Gynecology

## 2023-06-06 VITALS — BP 118/72 | HR 62 | Ht 63.0 in | Wt 139.0 lb

## 2023-06-06 DIAGNOSIS — Z9289 Personal history of other medical treatment: Secondary | ICD-10-CM

## 2023-06-06 DIAGNOSIS — Z01419 Encounter for gynecological examination (general) (routine) without abnormal findings: Secondary | ICD-10-CM

## 2023-06-06 DIAGNOSIS — Z853 Personal history of malignant neoplasm of breast: Secondary | ICD-10-CM

## 2023-06-06 NOTE — Patient Instructions (Signed)

## 2023-06-07 DIAGNOSIS — J3089 Other allergic rhinitis: Secondary | ICD-10-CM | POA: Diagnosis not present

## 2023-06-07 DIAGNOSIS — J301 Allergic rhinitis due to pollen: Secondary | ICD-10-CM | POA: Diagnosis not present

## 2023-06-07 DIAGNOSIS — J3081 Allergic rhinitis due to animal (cat) (dog) hair and dander: Secondary | ICD-10-CM | POA: Diagnosis not present

## 2023-06-13 DIAGNOSIS — J3081 Allergic rhinitis due to animal (cat) (dog) hair and dander: Secondary | ICD-10-CM | POA: Diagnosis not present

## 2023-06-13 DIAGNOSIS — J3089 Other allergic rhinitis: Secondary | ICD-10-CM | POA: Diagnosis not present

## 2023-06-13 DIAGNOSIS — J301 Allergic rhinitis due to pollen: Secondary | ICD-10-CM | POA: Diagnosis not present

## 2023-06-14 DIAGNOSIS — W01198A Fall on same level from slipping, tripping and stumbling with subsequent striking against other object, initial encounter: Secondary | ICD-10-CM | POA: Diagnosis not present

## 2023-06-14 DIAGNOSIS — S01511A Laceration without foreign body of lip, initial encounter: Secondary | ICD-10-CM | POA: Diagnosis not present

## 2023-06-20 DIAGNOSIS — J3089 Other allergic rhinitis: Secondary | ICD-10-CM | POA: Diagnosis not present

## 2023-06-20 DIAGNOSIS — J301 Allergic rhinitis due to pollen: Secondary | ICD-10-CM | POA: Diagnosis not present

## 2023-06-20 DIAGNOSIS — J3081 Allergic rhinitis due to animal (cat) (dog) hair and dander: Secondary | ICD-10-CM | POA: Diagnosis not present

## 2023-06-27 DIAGNOSIS — J3089 Other allergic rhinitis: Secondary | ICD-10-CM | POA: Diagnosis not present

## 2023-06-27 DIAGNOSIS — J301 Allergic rhinitis due to pollen: Secondary | ICD-10-CM | POA: Diagnosis not present

## 2023-07-03 DIAGNOSIS — D1801 Hemangioma of skin and subcutaneous tissue: Secondary | ICD-10-CM | POA: Diagnosis not present

## 2023-07-03 DIAGNOSIS — I788 Other diseases of capillaries: Secondary | ICD-10-CM | POA: Diagnosis not present

## 2023-07-03 DIAGNOSIS — L814 Other melanin hyperpigmentation: Secondary | ICD-10-CM | POA: Diagnosis not present

## 2023-07-03 DIAGNOSIS — L905 Scar conditions and fibrosis of skin: Secondary | ICD-10-CM | POA: Diagnosis not present

## 2023-07-03 DIAGNOSIS — L821 Other seborrheic keratosis: Secondary | ICD-10-CM | POA: Diagnosis not present

## 2023-07-04 DIAGNOSIS — J3081 Allergic rhinitis due to animal (cat) (dog) hair and dander: Secondary | ICD-10-CM | POA: Diagnosis not present

## 2023-07-04 DIAGNOSIS — J3089 Other allergic rhinitis: Secondary | ICD-10-CM | POA: Diagnosis not present

## 2023-07-04 DIAGNOSIS — J301 Allergic rhinitis due to pollen: Secondary | ICD-10-CM | POA: Diagnosis not present

## 2023-07-18 DIAGNOSIS — J3081 Allergic rhinitis due to animal (cat) (dog) hair and dander: Secondary | ICD-10-CM | POA: Diagnosis not present

## 2023-07-18 DIAGNOSIS — J3089 Other allergic rhinitis: Secondary | ICD-10-CM | POA: Diagnosis not present

## 2023-07-18 DIAGNOSIS — J301 Allergic rhinitis due to pollen: Secondary | ICD-10-CM | POA: Diagnosis not present

## 2023-07-25 DIAGNOSIS — J3089 Other allergic rhinitis: Secondary | ICD-10-CM | POA: Diagnosis not present

## 2023-07-25 DIAGNOSIS — J3081 Allergic rhinitis due to animal (cat) (dog) hair and dander: Secondary | ICD-10-CM | POA: Diagnosis not present

## 2023-07-25 DIAGNOSIS — J301 Allergic rhinitis due to pollen: Secondary | ICD-10-CM | POA: Diagnosis not present

## 2023-08-01 DIAGNOSIS — J301 Allergic rhinitis due to pollen: Secondary | ICD-10-CM | POA: Diagnosis not present

## 2023-08-01 DIAGNOSIS — J3081 Allergic rhinitis due to animal (cat) (dog) hair and dander: Secondary | ICD-10-CM | POA: Diagnosis not present

## 2023-08-01 DIAGNOSIS — J3089 Other allergic rhinitis: Secondary | ICD-10-CM | POA: Diagnosis not present

## 2023-08-09 DIAGNOSIS — J3081 Allergic rhinitis due to animal (cat) (dog) hair and dander: Secondary | ICD-10-CM | POA: Diagnosis not present

## 2023-08-09 DIAGNOSIS — J301 Allergic rhinitis due to pollen: Secondary | ICD-10-CM | POA: Diagnosis not present

## 2023-08-09 DIAGNOSIS — J3089 Other allergic rhinitis: Secondary | ICD-10-CM | POA: Diagnosis not present

## 2023-08-14 DIAGNOSIS — E785 Hyperlipidemia, unspecified: Secondary | ICD-10-CM | POA: Diagnosis not present

## 2023-08-14 DIAGNOSIS — I10 Essential (primary) hypertension: Secondary | ICD-10-CM | POA: Diagnosis not present

## 2023-08-14 DIAGNOSIS — M810 Age-related osteoporosis without current pathological fracture: Secondary | ICD-10-CM | POA: Diagnosis not present

## 2023-08-14 DIAGNOSIS — D638 Anemia in other chronic diseases classified elsewhere: Secondary | ICD-10-CM | POA: Diagnosis not present

## 2023-08-15 DIAGNOSIS — J301 Allergic rhinitis due to pollen: Secondary | ICD-10-CM | POA: Diagnosis not present

## 2023-08-15 DIAGNOSIS — J3089 Other allergic rhinitis: Secondary | ICD-10-CM | POA: Diagnosis not present

## 2023-08-15 DIAGNOSIS — J3081 Allergic rhinitis due to animal (cat) (dog) hair and dander: Secondary | ICD-10-CM | POA: Diagnosis not present

## 2023-08-21 DIAGNOSIS — J301 Allergic rhinitis due to pollen: Secondary | ICD-10-CM | POA: Diagnosis not present

## 2023-08-21 DIAGNOSIS — J3081 Allergic rhinitis due to animal (cat) (dog) hair and dander: Secondary | ICD-10-CM | POA: Diagnosis not present

## 2023-08-21 DIAGNOSIS — J3089 Other allergic rhinitis: Secondary | ICD-10-CM | POA: Diagnosis not present

## 2023-08-28 DIAGNOSIS — H3540 Unspecified peripheral retinal degeneration: Secondary | ICD-10-CM | POA: Diagnosis not present

## 2023-08-29 DIAGNOSIS — J301 Allergic rhinitis due to pollen: Secondary | ICD-10-CM | POA: Diagnosis not present

## 2023-08-29 DIAGNOSIS — J3089 Other allergic rhinitis: Secondary | ICD-10-CM | POA: Diagnosis not present

## 2023-09-05 DIAGNOSIS — J3081 Allergic rhinitis due to animal (cat) (dog) hair and dander: Secondary | ICD-10-CM | POA: Diagnosis not present

## 2023-09-05 DIAGNOSIS — J3089 Other allergic rhinitis: Secondary | ICD-10-CM | POA: Diagnosis not present

## 2023-09-05 DIAGNOSIS — J301 Allergic rhinitis due to pollen: Secondary | ICD-10-CM | POA: Diagnosis not present

## 2023-09-14 DIAGNOSIS — J301 Allergic rhinitis due to pollen: Secondary | ICD-10-CM | POA: Diagnosis not present

## 2023-09-14 DIAGNOSIS — J3089 Other allergic rhinitis: Secondary | ICD-10-CM | POA: Diagnosis not present

## 2023-09-14 DIAGNOSIS — J3081 Allergic rhinitis due to animal (cat) (dog) hair and dander: Secondary | ICD-10-CM | POA: Diagnosis not present

## 2023-09-20 DIAGNOSIS — J3081 Allergic rhinitis due to animal (cat) (dog) hair and dander: Secondary | ICD-10-CM | POA: Diagnosis not present

## 2023-09-20 DIAGNOSIS — J301 Allergic rhinitis due to pollen: Secondary | ICD-10-CM | POA: Diagnosis not present

## 2023-09-20 DIAGNOSIS — J3089 Other allergic rhinitis: Secondary | ICD-10-CM | POA: Diagnosis not present

## 2023-09-26 DIAGNOSIS — J3089 Other allergic rhinitis: Secondary | ICD-10-CM | POA: Diagnosis not present

## 2023-09-26 DIAGNOSIS — J301 Allergic rhinitis due to pollen: Secondary | ICD-10-CM | POA: Diagnosis not present

## 2023-09-26 DIAGNOSIS — J3081 Allergic rhinitis due to animal (cat) (dog) hair and dander: Secondary | ICD-10-CM | POA: Diagnosis not present

## 2023-10-05 DIAGNOSIS — J301 Allergic rhinitis due to pollen: Secondary | ICD-10-CM | POA: Diagnosis not present

## 2023-10-05 DIAGNOSIS — J3089 Other allergic rhinitis: Secondary | ICD-10-CM | POA: Diagnosis not present

## 2023-10-05 DIAGNOSIS — J3081 Allergic rhinitis due to animal (cat) (dog) hair and dander: Secondary | ICD-10-CM | POA: Diagnosis not present

## 2023-10-12 DIAGNOSIS — J3081 Allergic rhinitis due to animal (cat) (dog) hair and dander: Secondary | ICD-10-CM | POA: Diagnosis not present

## 2023-10-12 DIAGNOSIS — J301 Allergic rhinitis due to pollen: Secondary | ICD-10-CM | POA: Diagnosis not present

## 2023-10-12 DIAGNOSIS — J3089 Other allergic rhinitis: Secondary | ICD-10-CM | POA: Diagnosis not present

## 2023-10-23 DIAGNOSIS — J301 Allergic rhinitis due to pollen: Secondary | ICD-10-CM | POA: Diagnosis not present

## 2023-10-23 DIAGNOSIS — J3089 Other allergic rhinitis: Secondary | ICD-10-CM | POA: Diagnosis not present

## 2023-10-23 DIAGNOSIS — J3081 Allergic rhinitis due to animal (cat) (dog) hair and dander: Secondary | ICD-10-CM | POA: Diagnosis not present

## 2023-10-30 DIAGNOSIS — J3081 Allergic rhinitis due to animal (cat) (dog) hair and dander: Secondary | ICD-10-CM | POA: Diagnosis not present

## 2023-10-30 DIAGNOSIS — J3089 Other allergic rhinitis: Secondary | ICD-10-CM | POA: Diagnosis not present

## 2023-10-30 DIAGNOSIS — J301 Allergic rhinitis due to pollen: Secondary | ICD-10-CM | POA: Diagnosis not present

## 2023-11-07 DIAGNOSIS — J3089 Other allergic rhinitis: Secondary | ICD-10-CM | POA: Diagnosis not present

## 2023-11-07 DIAGNOSIS — J3081 Allergic rhinitis due to animal (cat) (dog) hair and dander: Secondary | ICD-10-CM | POA: Diagnosis not present

## 2023-11-07 DIAGNOSIS — J301 Allergic rhinitis due to pollen: Secondary | ICD-10-CM | POA: Diagnosis not present

## 2023-11-14 DIAGNOSIS — J3081 Allergic rhinitis due to animal (cat) (dog) hair and dander: Secondary | ICD-10-CM | POA: Diagnosis not present

## 2023-11-14 DIAGNOSIS — J301 Allergic rhinitis due to pollen: Secondary | ICD-10-CM | POA: Diagnosis not present

## 2023-11-14 DIAGNOSIS — J3089 Other allergic rhinitis: Secondary | ICD-10-CM | POA: Diagnosis not present

## 2023-11-21 DIAGNOSIS — J3089 Other allergic rhinitis: Secondary | ICD-10-CM | POA: Diagnosis not present

## 2023-11-21 DIAGNOSIS — J3081 Allergic rhinitis due to animal (cat) (dog) hair and dander: Secondary | ICD-10-CM | POA: Diagnosis not present

## 2023-11-21 DIAGNOSIS — J301 Allergic rhinitis due to pollen: Secondary | ICD-10-CM | POA: Diagnosis not present

## 2023-11-30 DIAGNOSIS — J3089 Other allergic rhinitis: Secondary | ICD-10-CM | POA: Diagnosis not present

## 2023-11-30 DIAGNOSIS — J301 Allergic rhinitis due to pollen: Secondary | ICD-10-CM | POA: Diagnosis not present

## 2023-11-30 DIAGNOSIS — J3081 Allergic rhinitis due to animal (cat) (dog) hair and dander: Secondary | ICD-10-CM | POA: Diagnosis not present

## 2023-12-06 DIAGNOSIS — J3089 Other allergic rhinitis: Secondary | ICD-10-CM | POA: Diagnosis not present

## 2023-12-06 DIAGNOSIS — J301 Allergic rhinitis due to pollen: Secondary | ICD-10-CM | POA: Diagnosis not present

## 2023-12-13 DIAGNOSIS — J301 Allergic rhinitis due to pollen: Secondary | ICD-10-CM | POA: Diagnosis not present

## 2023-12-13 DIAGNOSIS — J3089 Other allergic rhinitis: Secondary | ICD-10-CM | POA: Diagnosis not present

## 2023-12-14 DIAGNOSIS — M810 Age-related osteoporosis without current pathological fracture: Secondary | ICD-10-CM | POA: Diagnosis not present

## 2023-12-15 ENCOUNTER — Telehealth (HOSPITAL_COMMUNITY): Payer: Self-pay

## 2023-12-15 NOTE — Telephone Encounter (Signed)
 Auth Submission: NO AUTH NEEDED Site of care: Site of care: [facility] Payer: [employer] Medication & CPT/J Code(s) submitted: Reclast  (Zolendronic acid) J3489 Diagnosis Code: M81.0 Route of submission (phone, fax, portal):  Phone # Fax # Auth type: Buy/Bill HB Units/visits requested: 5mg  x 1 dose Reference number:  Approval from: 12/15/23 to 04/24/24

## 2023-12-20 DIAGNOSIS — J3081 Allergic rhinitis due to animal (cat) (dog) hair and dander: Secondary | ICD-10-CM | POA: Diagnosis not present

## 2023-12-20 DIAGNOSIS — J3089 Other allergic rhinitis: Secondary | ICD-10-CM | POA: Diagnosis not present

## 2023-12-20 DIAGNOSIS — J301 Allergic rhinitis due to pollen: Secondary | ICD-10-CM | POA: Diagnosis not present

## 2023-12-27 DIAGNOSIS — J3081 Allergic rhinitis due to animal (cat) (dog) hair and dander: Secondary | ICD-10-CM | POA: Diagnosis not present

## 2023-12-27 DIAGNOSIS — J3089 Other allergic rhinitis: Secondary | ICD-10-CM | POA: Diagnosis not present

## 2023-12-27 DIAGNOSIS — J301 Allergic rhinitis due to pollen: Secondary | ICD-10-CM | POA: Diagnosis not present

## 2024-01-02 DIAGNOSIS — J3089 Other allergic rhinitis: Secondary | ICD-10-CM | POA: Diagnosis not present

## 2024-01-02 DIAGNOSIS — J3081 Allergic rhinitis due to animal (cat) (dog) hair and dander: Secondary | ICD-10-CM | POA: Diagnosis not present

## 2024-01-02 DIAGNOSIS — J301 Allergic rhinitis due to pollen: Secondary | ICD-10-CM | POA: Diagnosis not present

## 2024-01-03 ENCOUNTER — Other Ambulatory Visit (HOSPITAL_COMMUNITY): Payer: Self-pay | Admitting: Internal Medicine

## 2024-01-03 DIAGNOSIS — M810 Age-related osteoporosis without current pathological fracture: Secondary | ICD-10-CM | POA: Insufficient documentation

## 2024-01-08 DIAGNOSIS — H1045 Other chronic allergic conjunctivitis: Secondary | ICD-10-CM | POA: Diagnosis not present

## 2024-01-08 DIAGNOSIS — J301 Allergic rhinitis due to pollen: Secondary | ICD-10-CM | POA: Diagnosis not present

## 2024-01-08 DIAGNOSIS — L309 Dermatitis, unspecified: Secondary | ICD-10-CM | POA: Diagnosis not present

## 2024-01-08 DIAGNOSIS — J3089 Other allergic rhinitis: Secondary | ICD-10-CM | POA: Diagnosis not present

## 2024-01-09 DIAGNOSIS — J3089 Other allergic rhinitis: Secondary | ICD-10-CM | POA: Diagnosis not present

## 2024-01-09 DIAGNOSIS — J301 Allergic rhinitis due to pollen: Secondary | ICD-10-CM | POA: Diagnosis not present

## 2024-01-15 ENCOUNTER — Ambulatory Visit (HOSPITAL_COMMUNITY)
Admission: RE | Admit: 2024-01-15 | Discharge: 2024-01-15 | Disposition: A | Discharge status: Home / Self Care | Source: Ambulatory Visit | Attending: Internal Medicine | Admitting: Internal Medicine

## 2024-01-15 VITALS — BP 110/68 | HR 69 | Temp 98.3°F | Resp 16

## 2024-01-15 DIAGNOSIS — M810 Age-related osteoporosis without current pathological fracture: Secondary | ICD-10-CM | POA: Insufficient documentation

## 2024-01-15 MED ORDER — ZOLEDRONIC ACID 5 MG/100ML IV SOLN
INTRAVENOUS | Status: AC
  Filled 2024-01-15: qty 100

## 2024-01-15 MED ORDER — ZOLEDRONIC ACID 5 MG/100ML IV SOLN
5.0000 mg | Freq: Once | INTRAVENOUS | Status: AC
  Administered 2024-01-15: 5 mg via INTRAVENOUS

## 2024-01-18 DIAGNOSIS — J3081 Allergic rhinitis due to animal (cat) (dog) hair and dander: Secondary | ICD-10-CM | POA: Diagnosis not present

## 2024-01-18 DIAGNOSIS — J301 Allergic rhinitis due to pollen: Secondary | ICD-10-CM | POA: Diagnosis not present

## 2024-01-18 DIAGNOSIS — J3089 Other allergic rhinitis: Secondary | ICD-10-CM | POA: Diagnosis not present

## 2024-01-22 ENCOUNTER — Other Ambulatory Visit: Payer: Self-pay | Admitting: Internal Medicine

## 2024-01-22 DIAGNOSIS — Z1231 Encounter for screening mammogram for malignant neoplasm of breast: Secondary | ICD-10-CM

## 2024-01-24 DIAGNOSIS — J301 Allergic rhinitis due to pollen: Secondary | ICD-10-CM | POA: Diagnosis not present

## 2024-01-24 DIAGNOSIS — J3089 Other allergic rhinitis: Secondary | ICD-10-CM | POA: Diagnosis not present

## 2024-01-30 DIAGNOSIS — J3089 Other allergic rhinitis: Secondary | ICD-10-CM | POA: Diagnosis not present

## 2024-01-30 DIAGNOSIS — J301 Allergic rhinitis due to pollen: Secondary | ICD-10-CM | POA: Diagnosis not present

## 2024-02-07 DIAGNOSIS — J301 Allergic rhinitis due to pollen: Secondary | ICD-10-CM | POA: Diagnosis not present

## 2024-02-07 DIAGNOSIS — J3089 Other allergic rhinitis: Secondary | ICD-10-CM | POA: Diagnosis not present

## 2024-02-12 DIAGNOSIS — Z1389 Encounter for screening for other disorder: Secondary | ICD-10-CM | POA: Diagnosis not present

## 2024-02-12 DIAGNOSIS — D638 Anemia in other chronic diseases classified elsewhere: Secondary | ICD-10-CM | POA: Diagnosis not present

## 2024-02-12 DIAGNOSIS — M810 Age-related osteoporosis without current pathological fracture: Secondary | ICD-10-CM | POA: Diagnosis not present

## 2024-02-12 DIAGNOSIS — E785 Hyperlipidemia, unspecified: Secondary | ICD-10-CM | POA: Diagnosis not present

## 2024-02-12 DIAGNOSIS — I10 Essential (primary) hypertension: Secondary | ICD-10-CM | POA: Diagnosis not present

## 2024-02-12 DIAGNOSIS — Z1212 Encounter for screening for malignant neoplasm of rectum: Secondary | ICD-10-CM | POA: Diagnosis not present

## 2024-02-13 DIAGNOSIS — J301 Allergic rhinitis due to pollen: Secondary | ICD-10-CM | POA: Diagnosis not present

## 2024-02-13 DIAGNOSIS — J3089 Other allergic rhinitis: Secondary | ICD-10-CM | POA: Diagnosis not present

## 2024-02-13 DIAGNOSIS — J3081 Allergic rhinitis due to animal (cat) (dog) hair and dander: Secondary | ICD-10-CM | POA: Diagnosis not present

## 2024-02-19 DIAGNOSIS — R82998 Other abnormal findings in urine: Secondary | ICD-10-CM | POA: Diagnosis not present

## 2024-02-19 DIAGNOSIS — I10 Essential (primary) hypertension: Secondary | ICD-10-CM | POA: Diagnosis not present

## 2024-02-20 DIAGNOSIS — J3089 Other allergic rhinitis: Secondary | ICD-10-CM | POA: Diagnosis not present

## 2024-02-20 DIAGNOSIS — J301 Allergic rhinitis due to pollen: Secondary | ICD-10-CM | POA: Diagnosis not present

## 2024-02-27 DIAGNOSIS — J3089 Other allergic rhinitis: Secondary | ICD-10-CM | POA: Diagnosis not present

## 2024-02-27 DIAGNOSIS — J3081 Allergic rhinitis due to animal (cat) (dog) hair and dander: Secondary | ICD-10-CM | POA: Diagnosis not present

## 2024-02-27 DIAGNOSIS — J301 Allergic rhinitis due to pollen: Secondary | ICD-10-CM | POA: Diagnosis not present

## 2024-02-29 ENCOUNTER — Ambulatory Visit
Admission: RE | Admit: 2024-02-29 | Discharge: 2024-02-29 | Disposition: A | Discharge status: Home / Self Care | Source: Ambulatory Visit | Attending: Internal Medicine | Admitting: Internal Medicine

## 2024-02-29 DIAGNOSIS — Z1231 Encounter for screening mammogram for malignant neoplasm of breast: Secondary | ICD-10-CM

## 2024-03-01 DIAGNOSIS — J3089 Other allergic rhinitis: Secondary | ICD-10-CM | POA: Diagnosis not present

## 2024-03-01 DIAGNOSIS — J301 Allergic rhinitis due to pollen: Secondary | ICD-10-CM | POA: Diagnosis not present

## 2024-03-06 DIAGNOSIS — J3081 Allergic rhinitis due to animal (cat) (dog) hair and dander: Secondary | ICD-10-CM | POA: Diagnosis not present

## 2024-03-06 DIAGNOSIS — J301 Allergic rhinitis due to pollen: Secondary | ICD-10-CM | POA: Diagnosis not present

## 2024-03-06 DIAGNOSIS — J3089 Other allergic rhinitis: Secondary | ICD-10-CM | POA: Diagnosis not present

## 2024-03-12 DIAGNOSIS — J3089 Other allergic rhinitis: Secondary | ICD-10-CM | POA: Diagnosis not present

## 2024-03-12 DIAGNOSIS — J3081 Allergic rhinitis due to animal (cat) (dog) hair and dander: Secondary | ICD-10-CM | POA: Diagnosis not present

## 2024-03-12 DIAGNOSIS — J301 Allergic rhinitis due to pollen: Secondary | ICD-10-CM | POA: Diagnosis not present

## 2024-03-19 DIAGNOSIS — J3089 Other allergic rhinitis: Secondary | ICD-10-CM | POA: Diagnosis not present

## 2024-03-19 DIAGNOSIS — J3081 Allergic rhinitis due to animal (cat) (dog) hair and dander: Secondary | ICD-10-CM | POA: Diagnosis not present

## 2024-03-19 DIAGNOSIS — J301 Allergic rhinitis due to pollen: Secondary | ICD-10-CM | POA: Diagnosis not present
